# Patient Record
Sex: Female | Born: 1950 | Race: White | Hispanic: No | Marital: Married | Smoking: Former smoker
Health system: Southern US, Community
[De-identification: ages and names within clinical notes are randomized; demographics above are authoritative.]

## PROBLEM LIST (undated history)

## (undated) DIAGNOSIS — H9319 Tinnitus, unspecified ear: Secondary | ICD-10-CM

## (undated) DIAGNOSIS — M4150 Other secondary scoliosis, site unspecified: Secondary | ICD-10-CM

## (undated) DIAGNOSIS — N393 Stress incontinence (female) (male): Secondary | ICD-10-CM

## (undated) DIAGNOSIS — M7070 Other bursitis of hip, unspecified hip: Secondary | ICD-10-CM

## (undated) DIAGNOSIS — E782 Mixed hyperlipidemia: Secondary | ICD-10-CM

## (undated) DIAGNOSIS — M47812 Spondylosis without myelopathy or radiculopathy, cervical region: Secondary | ICD-10-CM

## (undated) DIAGNOSIS — E538 Deficiency of other specified B group vitamins: Secondary | ICD-10-CM

## (undated) DIAGNOSIS — M7541 Impingement syndrome of right shoulder: Secondary | ICD-10-CM

## (undated) DIAGNOSIS — N63 Unspecified lump in unspecified breast: Secondary | ICD-10-CM

## (undated) DIAGNOSIS — M7071 Other bursitis of hip, right hip: Secondary | ICD-10-CM

## (undated) DIAGNOSIS — Z973 Presence of spectacles and contact lenses: Secondary | ICD-10-CM

## (undated) DIAGNOSIS — F411 Generalized anxiety disorder: Secondary | ICD-10-CM

## (undated) DIAGNOSIS — F32A Depression, unspecified: Secondary | ICD-10-CM

## (undated) DIAGNOSIS — R112 Nausea with vomiting, unspecified: Secondary | ICD-10-CM

## (undated) DIAGNOSIS — M5136 Other intervertebral disc degeneration, lumbar region: Secondary | ICD-10-CM

## (undated) DIAGNOSIS — H409 Unspecified glaucoma: Secondary | ICD-10-CM

## (undated) HISTORY — DX: Spondylosis without myelopathy or radiculopathy, cervical region: M47.812

## (undated) HISTORY — PX: CARPAL TUNNEL RELEASE: SHX101

## (undated) HISTORY — PX: PLANTAR FASCIA SURGERY: SHX746

## (undated) HISTORY — PX: TUBAL LIGATION: SHX77

## (undated) HISTORY — DX: Other bursitis of hip, unspecified hip: M70.70

## (undated) HISTORY — DX: Deficiency of other specified B group vitamins: E53.8

## (undated) HISTORY — DX: Generalized anxiety disorder: F41.1

## (undated) HISTORY — DX: Mixed hyperlipidemia: E78.2

## (undated) HISTORY — DX: Tinnitus, unspecified ear: H93.19

## (undated) HISTORY — DX: Unspecified lump in unspecified breast: N63.0

---

## 2008-06-08 IMAGING — US MAMMO-RUNI-US
1 series · 6 of 6 positions shown · non-contrast
Comparison: NONE

CLINICAL DATA: Rotchel Aleem.(GRIMMEL)(SINE)   Diagnostic Mammogram. 

RIGHT BREAST MAMMOGRAM ADDITIONAL VIEWS AND RIGHT BREAST 
ULTRASOUND

[Series 1: us breast · 0.14mm/px · 6 of 6 slices shown]
[im 1/6]
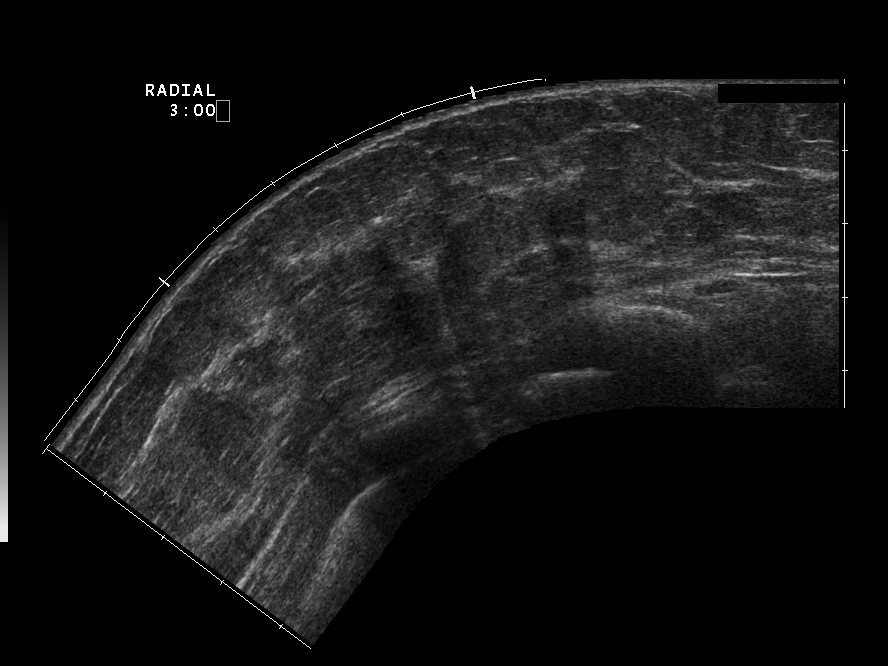
[im 2/6]
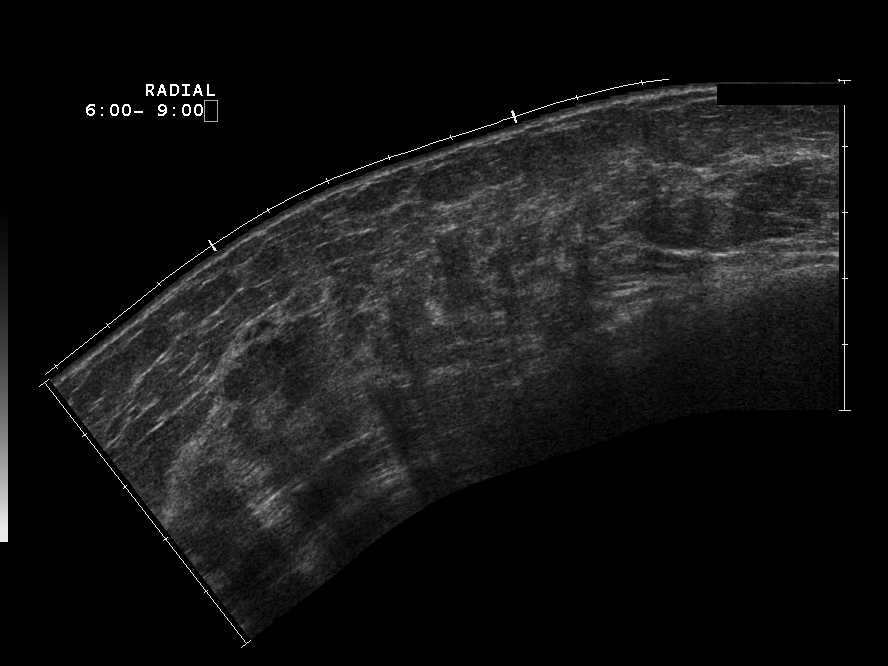
[im 3/6]
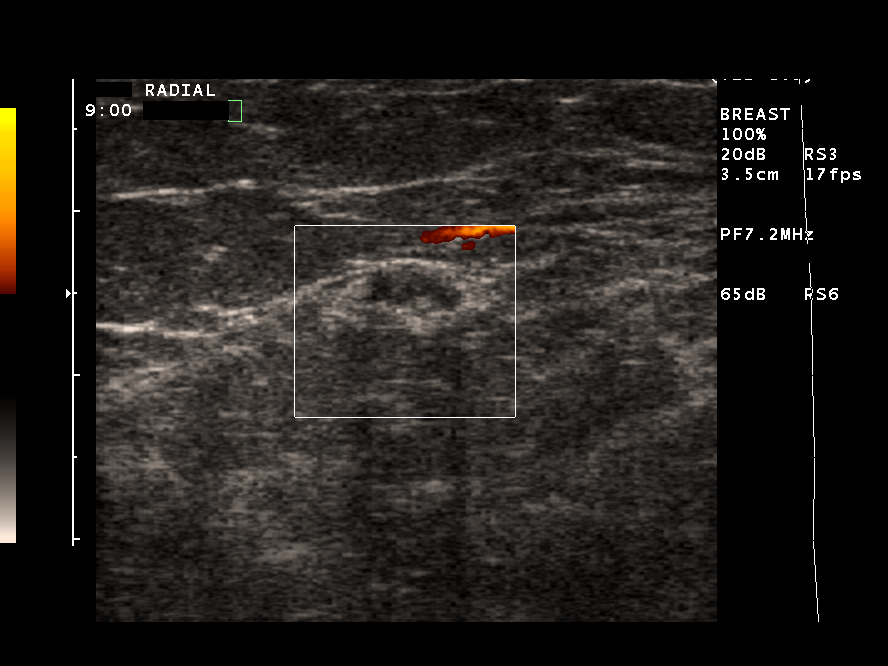
[im 4/6]
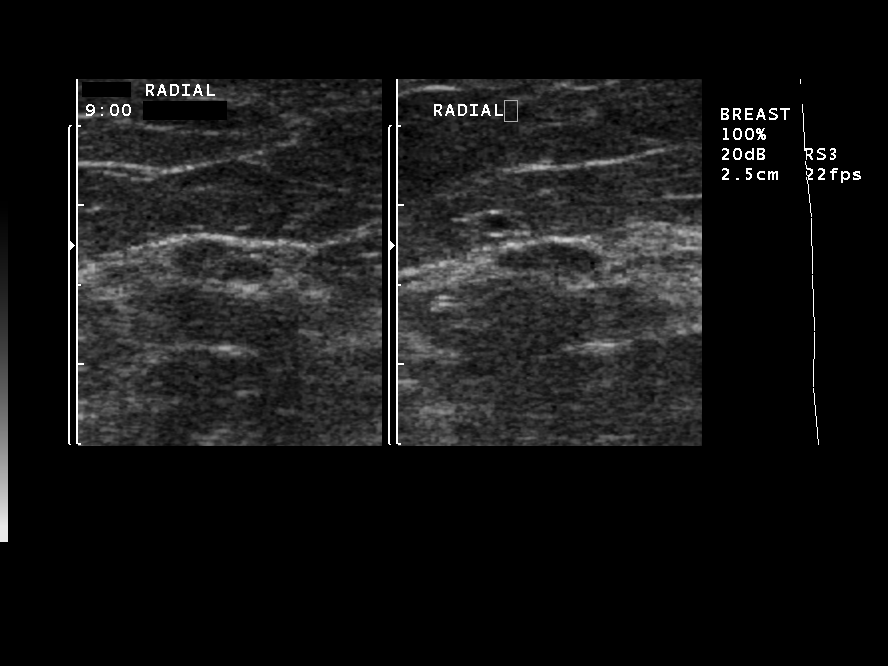
[im 5/6]
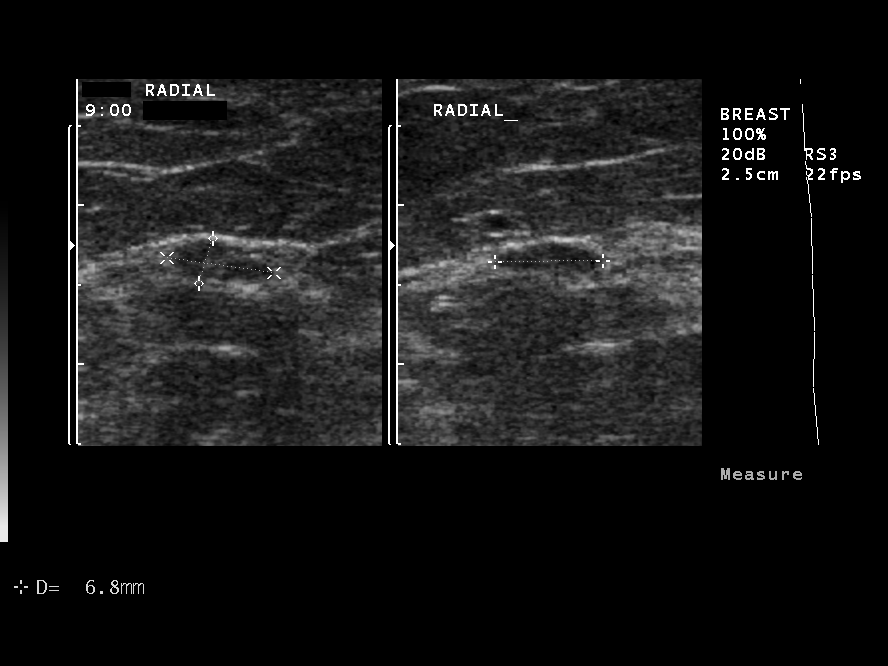
[im 6/6]
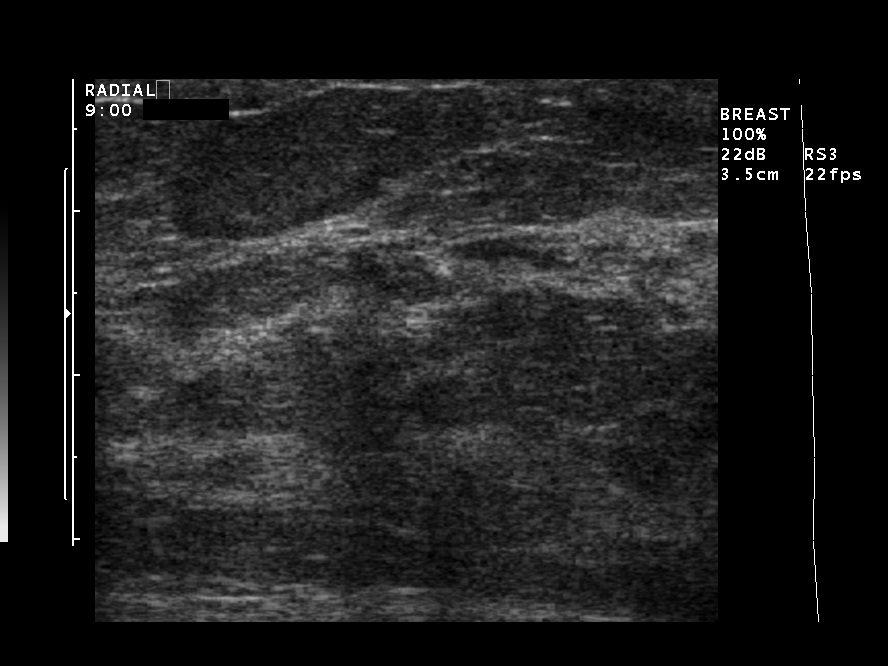

[6 of 6 positions shown; findings below may reference images not displayed]

FINDINGS: Spot compression of the right breast in the CC view 
and true lateral view of the right breast fails to demonstrate an 
area of architectural distortion, spiculation, mass or suspicious 
calcification.  Right breast ultrasound demonstrates minimal 
fibrocystic change in the 9 o???clock position.  No suspicious solid 
or shadowing masses.
IMPRESSION: Benign appearing changes mammographically and by 
ultrasound.  Bilateral mammogram in one year is recommended. The 
patient was informed at the time of the examination of the 
findings and recommendations by verbal and written lay report. 
Computer assisted (Second Look) technology was used as an aid in 
interpretation of this study. BI-RADS 2- Benign Eq, Dante Alberto Date:  12/19/2007 IOLANDO ZARDINI

## 2008-12-10 HISTORY — PX: PLANTAR FASCIA SURGERY: SHX746

## 2008-12-10 HISTORY — PX: CARPAL TUNNEL RELEASE: SHX101

## 2011-09-10 ENCOUNTER — Emergency Department (HOSPITAL_COMMUNITY)
Admission: EM | Admit: 2011-09-10 | Discharge: 2011-09-11 | Disposition: A | Discharge status: Home / Self Care | Payer: 59 | Attending: Emergency Medicine | Admitting: Emergency Medicine

## 2011-09-10 DIAGNOSIS — R300 Dysuria: Secondary | ICD-10-CM | POA: Insufficient documentation

## 2011-09-10 DIAGNOSIS — N390 Urinary tract infection, site not specified: Secondary | ICD-10-CM | POA: Insufficient documentation

## 2011-09-10 LAB — URINALYSIS, ROUTINE W REFLEX MICROSCOPIC
Nitrite: POSITIVE — AB
Protein, ur: 100 mg/dL — AB
Specific Gravity, Urine: 1.008 (ref 1.005–1.030)
Urobilinogen, UA: 1 mg/dL (ref 0.0–1.0)

## 2011-09-10 LAB — URINE MICROSCOPIC-ADD ON

## 2011-09-13 LAB — URINE CULTURE: Colony Count: >=100000

## 2017-03-25 ENCOUNTER — Ambulatory Visit (INDEPENDENT_AMBULATORY_CARE_PROVIDER_SITE_OTHER): Payer: Medicare HMO | Admitting: Podiatry

## 2017-03-25 ENCOUNTER — Ambulatory Visit (INDEPENDENT_AMBULATORY_CARE_PROVIDER_SITE_OTHER): Payer: Medicare HMO

## 2017-03-25 ENCOUNTER — Encounter: Payer: Self-pay | Admitting: Podiatry

## 2017-03-25 DIAGNOSIS — M19071 Primary osteoarthritis, right ankle and foot: Secondary | ICD-10-CM

## 2017-03-25 DIAGNOSIS — M79671 Pain in right foot: Secondary | ICD-10-CM

## 2017-03-25 DIAGNOSIS — M779 Enthesopathy, unspecified: Secondary | ICD-10-CM | POA: Diagnosis not present

## 2017-03-25 DIAGNOSIS — M7751 Other enthesopathy of right foot: Secondary | ICD-10-CM | POA: Diagnosis not present

## 2017-03-25 DIAGNOSIS — M778 Other enthesopathies, not elsewhere classified: Secondary | ICD-10-CM

## 2017-03-25 MED ORDER — NONFORMULARY OR COMPOUNDED ITEM: 1.0000 g | Freq: Four times a day (QID) | 2 refills | Status: DC

## 2017-03-28 NOTE — Progress Notes (Signed)
   Subjective: Patient is a 66 year old female presenting today as a new patient complaining of gradually worsening cramping foot pain to the right medial side near the arch that has been ongoing for approximately one year. Standing on her feet for prolonged periods of time increases the pain. She denies any trauma or injury.    Objective/Physical Exam General: The patient is alert and oriented x3 in no acute distress.  Dermatology: Skin is warm, dry and supple bilateral lower extremities. Negative for open lesions or macerations.  Vascular: Palpable pedal pulses bilaterally. No edema or erythema noted. Capillary refill within normal limits.  Neurological: Epicritic and protective threshold grossly intact bilaterally.   Musculoskeletal Exam: Pain with palpation to the right 1st met-cuneiform joint. Range of motion within normal limits to all pedal and ankle joints bilateral. Muscle strength 5/5 in all groups bilateral.  Radiographic Exam:  No fracture identified. Degenerative changes noted diffusely throughout the midfoot consistent with the midfoot DJD/arthritis. 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 #2 anti-inflammatory pain cream to be dispensed through [facility] #3 Injection of 0.5 mLs Celestone Soluspan injected into 1st met-cuneiform joint-right #4 continue OTC Aleve #5 Return to clinic when necessary   Edrick Kins, DPM [facility]  Dr. Edrick Kins, [address]                                        [place], [place] [address]                Office [phone]  Fax [phone]

## 2017-04-01 MED ORDER — BETAMETHASONE SOD PHOS & ACET 6 (3-3) MG/ML IJ SUSP: 3.0000 mg | Freq: Once | INTRAMUSCULAR | Status: DC

## 2018-03-06 ENCOUNTER — Encounter: Payer: Self-pay | Admitting: Neurology

## 2018-03-06 ENCOUNTER — Ambulatory Visit: Payer: Medicare HMO | Admitting: Neurology

## 2018-03-06 ENCOUNTER — Encounter (INDEPENDENT_AMBULATORY_CARE_PROVIDER_SITE_OTHER): Payer: Self-pay

## 2018-03-06 VITALS — BP 125/65 | HR 64 | Ht 64.0 in | Wt 162.0 lb

## 2018-03-06 DIAGNOSIS — E663 Overweight: Secondary | ICD-10-CM | POA: Diagnosis not present

## 2018-03-06 DIAGNOSIS — G479 Sleep disorder, unspecified: Secondary | ICD-10-CM | POA: Diagnosis not present

## 2018-03-06 DIAGNOSIS — F39 Unspecified mood [affective] disorder: Secondary | ICD-10-CM | POA: Diagnosis not present

## 2018-03-06 DIAGNOSIS — R0683 Snoring: Secondary | ICD-10-CM

## 2018-03-06 DIAGNOSIS — G4719 Other hypersomnia: Secondary | ICD-10-CM | POA: Diagnosis not present

## 2018-03-06 DIAGNOSIS — R351 Nocturia: Secondary | ICD-10-CM | POA: Diagnosis not present

## 2018-03-06 NOTE — Patient Instructions (Addendum)

## 2018-03-06 NOTE — Progress Notes (Signed)
Subjective:    Patient ID: Deborah Fowler is a 67 y.o. female.  HPI     Star Age, MD, PhD [facility] [address], [address] [place] [address], [place] [address]  Dear Dr. Rex Kras,   I saw your patient, Deborah Fowler, upon your kind request in my neurologic clinic today for initial consultation of her sleep disorder, in particular, concern for underlying obstructive sleep apnea. The patient is unaccompanied today. As you know, Deborah Fowler is a 67 year old right-handed woman with an underlying medical history of hyperlipidemia, vitamin B12 deficiency, neck pain, and overweight state, who reports snoring and excessive daytime somnolence as well as sleep disruption. I reviewed your office note from 01/23/2018, which you kindly included. She is married and lives with her husband. They have 1 child. She quit smoking in 1980, drinks alcohol occasionally, 1 or 2 glasses per month on average, caffeine in the form of coffee, 2 cups per day on average. Her Epworth sleepiness score is 12 out of 24 today, fatigue score is 19 out of 63. She works for [employer], part-time, 3 days a week. She has to get up at 5 or 6 for this, otherwise she can sleep in and reports that she can sleep up to 12 hours at a time. She has noted recently flareup of her depression. She has a long-standing history of depression and has been off and on medication since her 23s. She restarted sertraline some 5 months ago and noticed an improvement. She noticed increase in anxiety and was started on clonazepam, she currently takes 0.5 mg at night. She has nocturia about once per average night, denies family history of sleep apnea or morning headaches. She has vivid dreams. Her husband has sleep apnea and uses a CPAP machine so she is familiar with CPAP, she is apprehensive about coming in for sleep study and considering CPAP. His snoring has actually improved since she was able to lose weight. She has lost about 38  pounds in one year through [employer].  Her Past Medical History Is Significant For: Past Medical History:  Diagnosis Date  . Anxiety state   . Breast lump   . Bursitis of hip   . Cervical arthritis   . Cobalamin deficiency   . Mixed hyperlipidemia   . Tinnitus     Her Past Surgical History Is Significant For:  Her Family History Is Significant For: No family history on file.  Her Social History Is Significant For: Social History   Socioeconomic History  . Marital status: Married    Spouse name: Not on file  . Number of children: Not on file  . Years of education: Not on file  . Highest education level: Not on file  Occupational History  . Not on file  Social Needs  . Financial resource strain: Not on file  . Food insecurity:    Worry: Not on file    Inability: Not on file  . Transportation needs:    Medical: Not on file    Non-medical: Not on file  Tobacco Use  . Smoking status: Unknown If Ever Smoked  . Smokeless tobacco: Never Used  Substance and Sexual Activity  . Alcohol use: Not on file  . Drug use: Not on file  . Sexual activity: Not on file  Lifestyle  . Physical activity:    Days per week: Not on file    Minutes per session: Not on file  . Stress: Not on file  Relationships  .  Social connections:    Talks on phone: Not on file    Gets together: Not on file    Attends religious service: Not on file    Active member of club or organization: Not on file    Attends meetings of clubs or organizations: Not on file    Relationship status: Not on file  Other Topics Concern  . Not on file  Social History Narrative  . Not on file    Her Allergies Are:  No Known Allergies:   Her Current Medications Are:  Outpatient Encounter Medications as of 03/06/2018  Medication Sig  . aspirin 325 MG tablet Take 325 mg by mouth daily.  . cetirizine (ZYRTEC) 10 MG tablet Take 10 mg by mouth daily.  . cholecalciferol (VITAMIN D) 1000 units tablet Take 1,000  Units by mouth daily.  . clonazePAM (KLONOPIN) 0.5 MG tablet Take 0.5 mg by mouth daily.  Marland Kitchen dicyclomine (BENTYL) 10 MG capsule Take 20 mg by mouth daily.  Marland Kitchen latanoprost (XALATAN) 0.005 % ophthalmic solution   . Omega-3 Fatty Acids (FISH OIL PO) Take 1,000 mg by mouth daily.  . polyethylene glycol (MIRALAX / GLYCOLAX) packet Take 17 g by mouth daily as needed.  . sertraline (ZOLOFT) 100 MG tablet Take 150 mg by mouth daily.  . timolol (TIMOPTIC) 0.5 % ophthalmic solution   . TRIAMCINOLONE ACETONIDE, TOP, 0.05 % OINT Apply topically.  . vitamin B-12 (CYANOCOBALAMIN) 1000 MCG tablet Take 1,000 mcg by mouth daily.  . [DISCONTINUED] celecoxib (CELEBREX) 200 MG capsule Take 200 mg by mouth daily.  . [DISCONTINUED] methocarbamol (ROBAXIN) 750 MG tablet Take 750 mg by mouth every 6 (six) hours as needed for muscle spasms.  . [DISCONTINUED] NONFORMULARY OR COMPOUNDED ITEM Apply 1-2 g topically 4 (four) times daily.  . [DISCONTINUED] oxybutynin (DITROPAN) 5 MG tablet Take 5 mg by mouth 2 (two) times daily as needed for bladder spasms.  . [DISCONTINUED] sertraline (ZOLOFT) 50 MG tablet Take 50 mg by mouth.   Facility-Administered Encounter Medications as of 03/06/2018  Medication  . betamethasone acetate-betamethasone sodium phosphate (CELESTONE) injection 3 mg  :  Review of Systems:  Out of a complete 14 point review of systems, all are reviewed and negative with the exception of these symptoms as listed below: Review of Systems  Neurological:       Pt presents today to discuss her sleep. Pt's apple watch informed her that she may not be sleeping well, and Dr. Rex Kras believes that pt needs a sleep study.   Epworth Sleepiness Scale 0= would never doze 1= slight chance of dozing 2= moderate chance of dozing 3= high chance of dozing  Sitting and reading: 2 Watching TV: 2 Sitting inactive in a public place (ex. Theater or meeting): 2 As a passenger in a car for an hour without a break: 3 Lying  down to rest in the afternoon: 3 Sitting and talking to someone: 0 Sitting quietly after lunch (no alcohol): 0 In a car, while stopped in traffic: 0 Total: 12     Objective:  Neurological Exam  Physical Exam Physical Examination:   Vitals:   03/06/18 0900  BP: 125/65  Pulse: 64    General Examination: The patient is a very pleasant 67 y.o. female in no acute distress. She appears well-developed and well-nourished and very well groomed.   HEENT: Normocephalic, atraumatic, pupils are equal, round and reactive to light and accommodation. Corrective eye glasses. Extraocular tracking is good without limitation to gaze excursion or  nystagmus noted. Normal smooth pursuit is noted. Hearing is grossly intact. Face is symmetric with normal facial animation and normal facial sensation. Speech is clear with no dysarthria noted. There is no hypophonia. There is no lip, neck/head, jaw or voice tremor. Neck is supple with full range of passive and active motion. There are no carotid bruits on auscultation. Oropharynx exam reveals: mild mouth dryness, good dental hygiene and mild airway crowding, due to smaller airway entry. Mallampati is class II. Tongue protrudes centrally and palate elevates symmetrically. Tonsils are absent. Neck size is 13  7/85 inches. She has a mild overbite.   Chest: Clear to auscultation without wheezing, rhonchi or crackles noted.  Heart: S1+S2+0, regular and normal without murmurs, rubs or gallops noted.   Abdomen: Soft, non-tender and non-distended with normal bowel sounds appreciated on auscultation.  Extremities: There is no pitting edema in the distal lower extremities bilaterally. Pedal pulses are intact.  Skin: Warm and dry without trophic changes noted.  Musculoskeletal: exam reveals no obvious joint deformities, tenderness or joint swelling or erythema.   Neurologically:  Mental status: The patient is awake, alert and oriented in all 4 spheres. Her immediate  and remote memory, attention, language skills and fund of knowledge are appropriate. There is no evidence of aphasia, agnosia, apraxia or anomia. Speech is clear with normal prosody and enunciation. Thought process is linear. Mood is normal and affect is normal.  Cranial nerves II - XII are as described above under HEENT exam. In addition: shoulder shrug is normal with equal shoulder height noted. Motor exam: Normal bulk, strength and tone is noted. There is no drift, tremor or rebound. Romberg is negative. Fine motor skills and coordination: intact with normal finger taps, normal hand movements, normal rapid alternating patting, normal foot taps and normal foot agility.  Cerebellar testing: No dysmetria or intention tremor on finger to nose testing. Heel to shin is unremarkable bilaterally. There is no truncal or gait ataxia.  Sensory exam: intact to light touch in the upper and lower extremities.  Gait, station and balance: She stands easily. No veering to one side is noted. No leaning to one side is noted. Posture is age-appropriate and stance is narrow based. Gait shows normal stride length and normal pace. No problems turning are noted. Tandem walk is challenging for her.   Assessment and Plan:  In summary, Deborah Fowler is a very pleasant 67 y.o.-year old female with an underlying medical history of hyperlipidemia, vitamin B12 deficiency, neck pain, and overweight state, whose history and physical exam are concerning for obstructive sleep apnea (OSA). I had a long chat with the patient about my findings and the diagnosis of OSA, its prognosis and treatment options. We talked about medical treatments, surgical interventions and non-pharmacological approaches. I explained in particular the risks and ramifications of untreated moderate to severe OSA, especially with respect to developing cardiovascular disease down the Road, including congestive heart failure, difficult to treat hypertension, cardiac  arrhythmias, or stroke. Even type 2 diabetes has, in part, been linked to untreated OSA. Symptoms of untreated OSA include daytime sleepiness, memory problems, mood irritability and mood disorder such as depression and anxiety, lack of energy, as well as recurrent headaches, especially morning headaches. We talked about trying to maintain a healthy lifestyle in general, as well as the importance of weight control. I encouraged the patient to eat healthy, exercise daily and keep well hydrated, to keep a scheduled bedtime and wake time routine, to not skip any meals and  eat healthy snacks in between meals. I advised the patient not to drive when feeling sleepy. I recommended the following at this time: sleep study with potential positive airway pressure titration. (We will score hypopneas at 4%).   I explained the sleep test procedure to the patient and also outlined possible surgical and non-surgical treatment options of OSA, including the use of a custom-made dental device (which would require a referral to a specialist dentist or oral surgeon), upper airway surgical options, such as pillar implants, radiofrequency surgery, tongue base surgery, and UPPP (which would involve a referral to an ENT surgeon). Rarely, jaw surgery such as mandibular advancement may be considered.  I also explained the CPAP treatment option to the patient, who indicated that she would be willing to try CPAP if the need arises. I explained the importance of being compliant with PAP treatment, not only for insurance purposes but primarily to improve Her symptoms, and for the patient's long term health benefit, including to reduce Her cardiovascular risks. I answered all her questions today and the patient was in agreement. I would like to see her back after the sleep study is completed and encouraged her to call with any interim questions, concerns, problems or updates.   Thank you very much for allowing me to participate in the care  of this nice patient. If I can be of any further assistance to you please do not hesitate to call me at [phone].  Sincerely,   Star Age, MD, PhD

## 2018-03-13 LAB — BASIC METABOLIC PANEL
BUN: 23 — AB (ref 4–21)
Creatinine: 0.7 (ref 0.5–1.1)
Glucose: 80
Potassium: 4.3 (ref 3.4–5.3)
Sodium: 141 (ref 137–147)

## 2018-03-13 LAB — LIPID PANEL
Cholesterol: 291 — AB (ref 0–200)
HDL: 65 (ref 35–70)
LDL Cholesterol: 200
Triglycerides: 118 (ref 40–160)

## 2018-03-13 LAB — TSH: TSH: 2.53 (ref 0.41–5.90)

## 2018-03-13 LAB — HEPATIC FUNCTION PANEL
ALT: 13 (ref 7–35)
AST: 15 (ref 13–35)
Alkaline Phosphatase: 58 (ref 25–125)
Bilirubin, Total: 0.4

## 2018-03-13 LAB — CBC AND DIFFERENTIAL
HCT: 42 (ref 36–46)
Hemoglobin: 14 (ref 12.0–16.0)
Neutrophils Absolute: 3014
Platelets: 180 (ref 150–399)
WBC: 5.5

## 2018-03-13 LAB — HEMOGLOBIN A1C: Hemoglobin A1C: 5.3

## 2018-03-13 LAB — VITAMIN B12: Vitamin B-12: >2000

## 2018-03-24 ENCOUNTER — Telehealth: Payer: Self-pay

## 2018-03-24 NOTE — Telephone Encounter (Signed)
Pt has called to cancel sleep study on 04/11/18. Pt does mot want to reschedule at this time.

## 2018-04-17 DIAGNOSIS — H401123 Primary open-angle glaucoma, left eye, severe stage: Secondary | ICD-10-CM | POA: Insufficient documentation

## 2019-05-28 ENCOUNTER — Other Ambulatory Visit: Payer: Self-pay

## 2019-05-28 ENCOUNTER — Encounter: Payer: Self-pay | Admitting: Family Medicine

## 2019-05-28 ENCOUNTER — Ambulatory Visit (INDEPENDENT_AMBULATORY_CARE_PROVIDER_SITE_OTHER): Payer: Medicare HMO | Admitting: Family Medicine

## 2019-05-28 VITALS — BP 125/83 | HR 70 | Temp 98.6°F | Ht 63.0 in | Wt 193.3 lb

## 2019-05-28 DIAGNOSIS — K5909 Other constipation: Secondary | ICD-10-CM | POA: Insufficient documentation

## 2019-05-28 DIAGNOSIS — K6289 Other specified diseases of anus and rectum: Secondary | ICD-10-CM | POA: Diagnosis not present

## 2019-05-28 DIAGNOSIS — F411 Generalized anxiety disorder: Secondary | ICD-10-CM | POA: Diagnosis not present

## 2019-05-28 DIAGNOSIS — R7303 Prediabetes: Secondary | ICD-10-CM | POA: Diagnosis not present

## 2019-05-28 DIAGNOSIS — E785 Hyperlipidemia, unspecified: Secondary | ICD-10-CM

## 2019-05-28 DIAGNOSIS — F339 Major depressive disorder, recurrent, unspecified: Secondary | ICD-10-CM

## 2019-05-28 DIAGNOSIS — R030 Elevated blood-pressure reading, without diagnosis of hypertension: Secondary | ICD-10-CM

## 2019-05-28 DIAGNOSIS — M7071 Other bursitis of hip, right hip: Secondary | ICD-10-CM | POA: Diagnosis not present

## 2019-05-28 DIAGNOSIS — M7072 Other bursitis of hip, left hip: Secondary | ICD-10-CM

## 2019-05-28 DIAGNOSIS — Z6834 Body mass index (BMI) 34.0-34.9, adult: Secondary | ICD-10-CM | POA: Diagnosis not present

## 2019-05-28 NOTE — Progress Notes (Signed)
New patient office visit note:  Impression and Recommendations:    1. Prediabetes   2. Bursitis of both hips, unspecified bursa   3. Chronic constipation- yrs and yrs.    4. Depression, recurrent ([place])   5. GAD (generalized anxiety disorder)   6. 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 Hyperlipidemia, unspecified hyperlipidemia type   8. Elevated blood pressure reading   9. BMI 34.0-34.9,adult     1. Prediabetes/elevated BMI Discussed with patient weight loss-she will continue with weight watchers for now. -Do not eat her exercise calories.  2. Bursitis of both hips, unspecified bursa Left hip greater than right.  Patient has history of injections. -Advised to avoid any inclines or declines in walking. -We will avoid biking climbing etc. -Ice 15- 20 minutes after every activity, and PRN pain -Follow-up if worsens and we may need x-rays and plus minus injection. -Could not advise for /against Celebrex or Aleve etc. since I do not know what her liver and kidney lab results are.  3. Chronic constipation- yrs and yrs.  Discussed with patient adequate hydration is imperative.  Only taking in less than 30 ounces daily-goal is 100 ounces daily. -May take 1/2-1 of MiraLAX daily.  4. Depression, recurrent ([place]) Has suffered from chronic depression most her life. -On Zoloft which helps with this as well as her anxiety.  5. GAD (generalized anxiety disorder) Having anxiety panic attacks. -Had used Xanax in the past but recommended patient does not use any clonazepam or Xanax for this. -Discussed with patient calm and headspace apps to do relaxation techniques several times daily.  6. 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 -Recommend she follow-up with gastroenterology for this in the future.  -Advised portable bidet to add to her toilet  7. Hyperlipidemia, unspecified hyperlipidemia type Patient did not tell me she had history of hyperlipidemia but according to medical records she does. \-We will obtain fasting lipid profile near future  8. Elevated blood pressure reading Patient blood pressure elevated today.  She denies history of hypertension.  We will recheck and have her monitor at home and bring back in near future  Education and routine counseling performed. Handouts provided.  -Asked CMA to please order full set of fasting blood work for near future and patient will make a video visit follow-up to discuss those results.   Gross side effects, risk and benefits, and alternatives of medications discussed with patient.  Patient is aware that all medications have potential side effects and we are unable to predict every side effect or drug-drug interaction that may occur.  Expresses verbal understanding and consents to current therapy plan and treatment regimen.  Return for follow-up near future FBW, then 2) doxy OV visit 3-5 days later.  Please see AVS handed out to patient at the end of our visit for further patient instructions/ counseling done pertaining to today's office visit.    Note:  This document was prepared using Dragon voice recognition software and may include unintentional dictation errors.     ---------------------------------------------------------------------------------------------------------------------------------------------------------------------------------------------    Subjective:    Chief complaint:   Chief Complaint  Patient presents with  . Establish Care     HPI: Deborah Fowler is a pleasant 68 y.o. female who presents to [place] [facility]  today to review their medical history with me and establish care.   I asked the patient to review their chronic problem list with me to ensure everything was updated and  accurate.    All recent office visits with other providers, any medical records that patient brought in etc  - I reviewed today.     We asked pt to get [place] their medical records from [facility] providers/ specialists that they had seen within the past 3-5 years- if they are in private practice and/or do not work for [employer], [facility], [place], [place] or [employer] owned practice.  Told them to call their specialists to clarify this if they are not sure.    2 yrs lost 47 lbs- [employer], recently went back on it.    Works part time at [employer] her job.   Biggest concerns- bursitis- in jhips- L >er R   Wt Readings from Last 3 Encounters:  06/11/19 195 lb (88.5 kg)  05/28/19 193 lb 4.8 oz (87.7 kg)  03/06/18 162 lb (73.5 kg)   BP Readings from Last 3 Encounters:  06/11/19 127/82  05/28/19 125/83  03/06/18 125/65   Pulse Readings from Last 3 Encounters:  05/28/19 70  03/06/18 64   BMI Readings from Last 3 Encounters:  06/11/19 34.54 kg/m  05/28/19 34.24 kg/m  03/06/18 27.81 kg/m    Patient Care Team    Relationship Specialty Notifications Start End  Mellody Dance, DO PCP - General Family Medicine  05/21/19   Tamsen Roers, MD Referring Physician Family Medicine  05/21/19     Patient Active Problem List   Diagnosis Date Noted  . Prediabetes 05/28/2019    Priority: High  . HLD (hyperlipidemia) 05/28/2019    Priority: High  . Elevated blood pressure reading 05/28/2019    Priority: High  . Bilateral hip bursitis 05/28/2019    Priority: Medium  . Depression, recurrent ([place]) 05/28/2019    Priority: Medium  . GAD (generalized anxiety disorder) with panic disorder 05/28/2019    Priority: Medium  . Chronic constipation- yrs and yrs.  05/28/2019    Priority: Low  . 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 Primary open angle glaucoma of left eye, severe stage 04/17/2018       As reported by pt:   Past Medical History:  Diagnosis Date  . Anxiety state   . Breast lump   . Bursitis of hip   . Cervical arthritis   . Cobalamin deficiency   . Mixed hyperlipidemia   . Tinnitus      Past Surgical History:  Procedure Laterality Date  . CARPAL TUNNEL RELEASE    . PLANTAR FASCIA SURGERY       Family History  Problem Relation Age of Onset  . Hyperlipidemia Mother   . Alcohol abuse Father   . Heart attack Brother   . Colon cancer Maternal Grandmother      Social History   Substance and Sexual Activity  Drug Use Never     Social History   Substance and Sexual Activity  Alcohol Use Yes  . Alcohol/week: 5.0 standard drinks  . Types: 5 Standard drinks or equivalent per week     Social History   Tobacco Use  Smoking Status Unknown If Ever Smoked  Smokeless Tobacco Never Used     Current Meds  Medication Sig  . ALPRAZolam (XANAX) 0.5 MG tablet Take 0.5 mg by  mouth as needed for anxiety.  . Calcium Carbonate (CALCIUM 600 PO) Take 1,200 mg by mouth.  . celecoxib (CELEBREX) 200 MG capsule Take 200 mg by mouth daily.  . cetirizine (ZYRTEC) 10 MG tablet Take 10 mg by mouth daily.  Marland Kitchen latanoprost (XALATAN) 0.005 % ophthalmic solution   . Omega-3 Fatty Acids (FISH OIL PO) Take 1,000 mg by mouth daily.  . polyethylene glycol (MIRALAX / GLYCOLAX) packet Take 17 g by mouth daily as needed.  . RESTASIS 0.05 % ophthalmic emulsion INSTILL 1 DROP INTO EACH EYE TWICE DAILY  . timolol (TIMOPTIC) 0.5 % ophthalmic solution   . TRIAMCINOLONE ACETONIDE, TOP, 0.05 % OINT Apply topically.  . vitamin B-12 (CYANOCOBALAMIN) 1000 MCG tablet Take 1,000 mcg by mouth daily.  . [DISCONTINUED] aspirin 325 MG tablet Take 325 mg by mouth 2 (two) times a day.   . [DISCONTINUED] sertraline (ZOLOFT) 100 MG tablet Take 100 mg by mouth daily.    Current Facility-Administered Medications for the 05/28/19 encounter (Office Visit) with Mellody Dance, DO  Medication  . betamethasone  acetate-betamethasone sodium phosphate (CELESTONE) injection 3 mg    Allergies: Patient has no known allergies.   Review of Systems  Constitutional: Negative for chills, diaphoresis, fever, malaise/fatigue and weight loss.  HENT: Negative for congestion, sore throat and tinnitus.   Eyes: Negative for blurred vision, double vision and photophobia.  Respiratory: Negative for cough and wheezing.   Cardiovascular: Negative for chest pain and palpitations.  Gastrointestinal: Negative for blood in stool, diarrhea, nausea and vomiting.  Genitourinary: Negative for dysuria, frequency and urgency.       Leaking urine  Musculoskeletal: Positive for joint pain and myalgias.       Bilateral hip bursitis-chronic  Skin: Negative for itching and rash.  Neurological: Negative for dizziness, focal weakness, weakness and headaches.  Endo/Heme/Allergies: Negative for environmental allergies and polydipsia. Does not bruise/bleed easily.  Psychiatric/Behavioral: Negative for depression and memory loss. The patient is not nervous/anxious and does not have insomnia.         Objective:   Blood pressure 125/83, pulse 70, temperature 98.6 F (37 C), temperature source Oral, height 5\' 3"  (1.6 m), weight 193 lb 4.8 oz (87.7 kg), SpO2 95 %. Body mass index is 34.24 kg/m. General: Well Developed, well nourished, and in no acute distress.  Neuro: Alert and oriented x3, extra-ocular muscles intact, sensation grossly intact.  HEENT:[place]/AT, PERRLA, neck supple, No carotid bruits Skin: no gross rashes  Cardiac: Regular rate and rhythm Respiratory: Essentially clear to auscultation bilaterally. Not using accessory muscles, speaking in full sentences.  Abdominal: not grossly distended Musculoskeletal: Ambulates w/o diff, FROM * 4 ext.  Vasc: less 2 sec cap RF, warm and pink  Psych:  No HI/SI, judgement and insight good, Euthymic mood. Full Affect.    Recent Results (from the past 2160 hour(s))  TSH      Status: None   Collection Time: 06/09/19  9:54 AM  Result Value Ref Range   TSH 4.070 0.450 - 4.500 uIU/mL  VITAMIN D 25 Hydroxy (Vit-D Deficiency, Fractures)     Status: None   Collection Time: 06/09/19  9:54 AM  Result Value Ref Range   Vit D, 25-Hydroxy 35.4 30.0 - 100.0 ng/mL    Comment: Vitamin D deficiency has been defined by the [place] and an Endocrine Society practice guideline as a level of serum 25-OH vitamin D less than 20 ng/mL (1,2). The Endocrine Society went on to further define vitamin D insufficiency  as a level between 21 and 29 ng/mL (2). 1. IOM (Institute of Medicine). 2010. Dietary reference    intakes for calcium and D. [place]: The    [employer]. 2. Holick MF, Binkley Seymour, Bischoff-Ferrari HA, et al.    Evaluation, treatment, and prevention of vitamin D    deficiency: an Endocrine Society clinical practice    guideline. JCEM. 2011 Jul; 96(7):1911-30.   T4, free     Status: None   Collection Time: 06/09/19  9:54 AM  Result Value Ref Range   Free T4 0.92 0.82 - 1.77 ng/dL  Lipid panel     Status: Abnormal   Collection Time: 06/09/19  9:54 AM  Result Value Ref Range   Cholesterol, Total 233 (H) 100 - 199 mg/dL   Triglycerides 196 (H) 0 - 149 mg/dL   HDL 61 >39 mg/dL   VLDL Cholesterol Cal 39 5 - 40 mg/dL   LDL Calculated 133 (H) 0 - 99 mg/dL   Chol/HDL Ratio 3.8 0.0 - 4.4 ratio    Comment:                                   T. Chol/HDL Ratio                                             Men  Women                               1/2 Avg.Risk  3.4    3.3                                   Avg.Risk  5.0    4.4                                2X Avg.Risk  9.6    7.1                                3X Avg.Risk 23.4   11.0   Hemoglobin A1c     Status: None   Collection Time: 06/09/19  9:54 AM  Result Value Ref Range   Hgb A1c MFr Bld 5.6 4.8 - 5.6 %    Comment:          Prediabetes: 5.7 - 6.4          Diabetes: >6.4           Glycemic control for adults with diabetes: <7.0    Est. average glucose Bld gHb Est-mCnc 114 mg/dL  Comprehensive metabolic panel     Status: Abnormal   Collection Time: 06/09/19  9:54 AM  Result Value Ref Range   Glucose 96 65 - 99 mg/dL   BUN 20 8 - 27 mg/dL   Creatinine, Ser 0.66 0.57 - 1.00 mg/dL   GFR calc non Af Amer 91 >59 mL/min/1.73   GFR calc Af Amer 105 >59 mL/min/1.73   BUN/Creatinine Ratio 30 (H) 12 - 28   Sodium 143 134 - 144 mmol/L   Potassium 5.2 3.5 -  5.2 mmol/L   Chloride 107 (H) 96 - 106 mmol/L   CO2 21 20 - 29 mmol/L   Calcium 9.2 8.7 - 10.3 mg/dL   Total Protein 6.0 6.0 - 8.5 g/dL   Albumin 4.2 3.8 - 4.8 g/dL   Globulin, Total 1.8 1.5 - 4.5 g/dL   Albumin/Globulin Ratio 2.3 (H) 1.2 - 2.2   Bilirubin Total 0.3 0.0 - 1.2 mg/dL   Alkaline Phosphatase 65 39 - 117 IU/L   AST 12 0 - 40 IU/L   ALT 14 0 - 32 IU/L  CBC with Differential/Platelet     Status: None   Collection Time: 06/09/19  9:54 AM  Result Value Ref Range   WBC 5.3 3.4 - 10.8 x10E3/uL   RBC 4.65 3.77 - 5.28 x10E6/uL   Hemoglobin 13.3 11.1 - 15.9 g/dL   Hematocrit 40.3 34.0 - 46.6 %   MCV 87 79 - 97 fL   MCH 28.6 26.6 - 33.0 pg   MCHC 33.0 31.5 - 35.7 g/dL   RDW 13.8 11.7 - 15.4 %   Platelets 189 150 - 450 x10E3/uL   Neutrophils 48 Not Estab. %   Lymphs 38 Not Estab. %   Monocytes 9 Not Estab. %   Eos 4 Not Estab. %   Basos 1 Not Estab. %   Neutrophils Absolute 2.6 1.4 - 7.0 x10E3/uL   Lymphocytes Absolute 2.0 0.7 - 3.1 x10E3/uL   Monocytes Absolute 0.5 0.1 - 0.9 x10E3/uL   EOS (ABSOLUTE) 0.2 0.0 - 0.4 x10E3/uL   Basophils Absolute 0.0 0.0 - 0.2 x10E3/uL   Immature Granulocytes 0 Not Estab. %   Immature Grans (Abs) 0.0 0.0 - 0.1 x10E3/uL

## 2019-05-28 NOTE — Patient Instructions (Signed)
Behavior Modification Ideas for Weight Management  Weight management involves adopting a healthy lifestyle that includes a knowledge of nutrition and exercise, a positive attitude and the right kind of motivation. Internal motives such as better health, increased energy, self-esteem and personal control increase your chances of lifelong weight management success.  Remember to have realistic goals and think long-term success. Believe in yourself and you can do it. The following information will give you ideas to help you meet your goals.  Control Your Home Environment  Eat only while sitting down at the kitchen or dining room table. Do not eat while watching television, reading, cooking, talking on the phone, standing at the refrigerator or working on the computer. Keep tempting foods out of the house -- don't buy them. Keep tempting foods out of sight. Have low-calorie foods ready to eat. Unless you are preparing a meal, stay out of the kitchen. Have healthy snacks at your disposal, such as small pieces of fruit, vegetables, canned fruit, pretzels, low-fat string cheese and nonfat cottage cheese.  Control Your Work Environment  Do not eat at [employer] or keep tempting snacks at your desk. If you get hungry between meals, plan healthy snacks and bring them with you to work. During your breaks, go for a walk instead of eating. If you work around food, plan in advance the one item you will eat at mealtime. Make it inconvenient to nibble on food by chewing gum, sugarless candy or drinking water or another low-calorie beverage. Do not work through meals. Skipping meals slows down metabolism and may result in overeating at the next meal. If food is available for special occasions, either pick the healthiest item, nibble on low-fat snacks brought from home, don't have anything offered, choose one option and have a small amount, or have only a beverage.  Control Your Mealtime Environment  Serve  your plate of food at the stove or kitchen counter. Do not put the serving dishes on the table. If you do put dishes on the table, remove them immediately when finished eating. Fill half of your plate with vegetables, a quarter with lean protein and a quarter with starch. Use smaller plates, bowls and glasses. A smaller portion will look large when it is in a little dish. Politely refuse second helpings. When fixing your plate, limit portions of food to one scoop/serving or less.   Daily Food Management  Replace eating with another activity that you will not associate with food. Wait 20 minutes before eating something you are craving. Drink a large glass of water or diet soda before eating. Always have a big glass or bottle of water to drink throughout the day. Avoid high-calorie add-ons such as cream with your coffee, butter, mayonnaise and salad dressings.  Shopping: Do not shop when hungry or tired. Shop from a list and avoid buying anything that is not on your list. If you must have tempting foods, buy individual-sized packages and try to find a lower-calorie alternative. Don't taste test in the store. Read food labels. Compare products to help you make the healthiest choices.  Preparation: Chew a piece of gum while cooking meals. Use a quarter teaspoon if you taste test your food. Try to only fix what you are going to eat, leaving yourself no chance for seconds. If you have prepared more food than you need, portion it into individual containers and freeze or refrigerate immediately. Don't snack while cooking meals.  Eating: Eat slowly. Remember it takes about 20 minutes  for your stomach to send a message to your brain that it is full. Don't let fake hunger make you think you need more. The ideal way to eat is to take a bite, put your utensil down, take a sip of water, cut your next bite, take a bit, put your utensil down and so on. Do not cut your food all at one time. Cut only  as needed. Take small bites and chew your food well. Stop eating for a minute or two at least once during a meal or snack. Take breaks to reflect and have conversation.  Cleanup and Leftovers: Label leftovers for a specific meal or snack. Freeze or refrigerate individual portions of leftovers. Do not clean up if you are still hungry.  Eating Out and Social Eating  Do not arrive hungry. Eat something light before the meal. Try to fill up on low-calorie foods, such as vegetables and fruit, and eat smaller portions of the high-calorie foods. Eat foods that you like, but choose small portions. If you want seconds, wait at least 20 minutes after you have eaten to see if you are actually hungry or if your eyes are bigger than your stomach. Limit alcoholic beverages. Try a soda water with a twist of lime. Do not skip other meals in the day to save room for the special event.  At Restaurants: Order  la carte rather than buffet style. Order some vegetables or a salad for an appetizer instead of eating bread. If you order a high-calorie dish, share it with someone. Try an after-dinner mint with your coffee. If you do have dessert, share it with two or more people. Don't overeat because you do not want to waste food. Ask for a doggie bag to take extra food home. Tell the server to put half of your entree in a to go bag before the meal is served to you. Ask for salad dressing, gravy or high-fat sauces on the side. Dip the tip of your fork in the dressing before each bite. If bread is served, ask for only one piece. Try it plain without butter or oil. At [employer] where oil and vinegar is served with bread, use only a small amount of oil and a lot of vinegar for dipping.  At a Friend[facility] House: Offer to bring a dish, appetizer or dessert that is low in calories. Serve yourself small portions or tell the host that you only want a small amount. Stand or sit away from the snack table. Stay  away from the kitchen or stay busy if you are near the food. Limit your alcohol intake.  At [employer] and Cafeterias: Cover most of your plate with lettuce and/or vegetables. Use a salad plate instead of a dinner plate. After eating, clear away your dishes before having coffee or tea.  Entertaining at Home: Explore low-fat, low-cholesterol cookbooks. Use single-serving foods like chicken breasts or hamburger patties. Prepare low-calorie appetizers and desserts.   Holidays: Keep tempting foods out of sight. Decorate the house without using food. Have low-calorie beverages and foods on hand for guests. Allow yourself one planned treat a day. Don't skip meals to save up for the holiday feast. Eat regular, planned meals.   Exercise Well  Make exercise a priority and a planned activity in the day. If possible, walk the entire or part of the distance to work. Get an exercise buddy. Go for a walk with a colleague during one of your breaks, go to the gym, run or  take a walk with a friend, walk in the mall with a shopping companion. Park at the end of the parking lot and walk to the store or office entrance. Always take the stairs all of the way or at least part of the way to your floor. If you have a desk job, walk around the office frequently. Do leg lifts while sitting at your desk. Do something outside on the weekends like going for a hike or a bike ride.   Have a Healthy Attitude  Make health your weight management priority. Be realistic. Have a goal to achieve a healthier you, not necessarily the lowest weight or ideal weight based on calculations or tables. Focus on a healthy eating style, not on dieting. Dieting usually lasts for a short amount of time and rarely produces long-term success. Think long term. You are developing new healthy behaviors to follow next month, in a year and in a decade.    This information is for educational purposes only and is not intended to replace  the advice of your doctor or health care provider. We encourage you to discuss with your doctor any questions or concerns you may have.        Guidelines for Losing Weight   We want weight loss that will last so you should lose 1-2 pounds a week.  THAT IS IT! Please pick THREE things a month to change. Once it is a habit check off the item. Then pick another three items off the list to become habits.  If you are already doing a habit on the list GREAT!  Cross that item off!  Dont drink your calories. Ie, alcohol, soda, fruit juice, and sweet tea.   Drink more water. Drink a glass when you feel hungry or before each meal.   Eat breakfast - Complex carb and protein (likeDannon light and fit yogurt, oatmeal, fruit, eggs, [place] bacon).  Measure your cereal.  Eat no more than one cup a day. (ie Kashi)  Eat an apple a day.  Add a vegetable a day.  Try a new vegetable a month.  Use Pam! Stop using oil or butter to cook.  Dont finish your plate or use smaller plates.  Share your dessert.  Eat sugar free Jello for dessert or frozen grapes.  Dont eat 2-3 hours before bed.  Switch to whole wheat bread, pasta, and brown rice.  Make healthier choices when you eat out. No fries!  Pick baked chicken, NOT fried.  Dont forget to SLOW DOWN when you eat. It is not going anywhere.   Take the stairs.  Park far away in the parking lot  Lift soup cans (or weights) for 10 minutes while watching TV.  Walk at work for 10 minutes during break.  Walk outside 1 time a week with your friend, kids, dog, or significant other.  Start a walking group at church.  Walk the mall as much as you can tolerate.   Keep a food diary.  Weigh yourself daily.  Walk for 15 minutes 3 days per week.  Cook at home more often and eat out less. If life happens and you go back to old habits, it is okay.  Just start over. You can do it!  If you experience chest pain, get short of breath, or tired  during the exercise, please stop immediately and inform your doctor.    Before you even begin to attack a weight-loss plan, it pays to remember this: You are not  fat. You have fat. Losing weight isn't about blame or shame; it's simply another achievement to accomplish. Dieting is like any other skill--you have to buckle down and work at it. As long as you act in a smart, reasonable way, you'll ultimately get where you want to be. Here are some weight loss pearls for you.   1. It's Not a Diet. It's a Lifestyle Thinking of a diet as something you're on and suffering through only for the short term doesn't work. To shed weight and keep it off, you need to make permanent changes to the way you eat. It's OK to indulge occasionally, of course, but if you cut calories temporarily and then revert to your old way of eating, you'll gain back the weight quicker than you can say yo-yo. Use it to lose it. Research shows that one of the best predictors of long-term weight loss is how many pounds you drop in the first month. For that reason, nutritionists often suggest being stricter for the first two weeks of your new eating strategy to build momentum. Cut out added sugar and alcohol and avoid unrefined carbs. After that, figure out how you can reincorporate them in a way that's healthy and maintainable.  2. There's a Right Way to Exercise Working out burns calories and fat and boosts your metabolism by building muscle. But those trying to lose weight are notorious for overestimating the number of calories they burn and underestimating the amount they take in. Unfortunately, your system is biologically programmed to hold on to extra pounds and that means when you start exercising, your body senses the deficit and ramps up its hunger signals. If you're not diligent, you'll eat everything you burn and then some. Use it, to lose it. Cardio gets all the exercise glory, but strength and interval training are the real  heroes. They help you build lean muscle, which in turn increases your metabolism and calorie-burning ability 3. Don't Overreact to Mild Hunger Some people have a hard time losing weight because of hunger anxiety. To them, being hungry is bad--something to be avoided at all costs--so they carry snacks with them and eat when they don't need to. Others eat because they're stressed out or bored. While you never want to get to the point of being ravenous (that's when bingeing is likely to happen), a hunger pang, a craving, or the fact that it's 3:00 p.m. should not send you racing for the vending machine or obsessing about the energy bar in your purse. Ideally, you should put off eating until your stomach is growling and it's difficult to concentrate.  Use it to lose it. When you feel the urge to eat, use the HALT method. Ask yourself, Am I really hungry? Or am I angry or anxious, lonely or bored, or tired? If you're still not certain, try the apple test. If you're truly hungry, an apple should seem delicious; if it doesn't, something else is going on. Or you can try drinking water and making yourself busy, if you are still hungry try a healthy snack.  4. Not All Calories Are Created Equal The mechanics of weight loss are pretty simple: Take in fewer calories than you use for energy. But the kind of food you eat makes all the difference. Processed food that's high in saturated fat and refined starch or sugar can cause inflammation that disrupts the hormone signals that tell your brain you're full. The result: You eat a lot more.  Use it to lose  it. Clean up your diet. Swap in whole, unprocessed foods, including vegetables, lean protein, and healthy fats that will fill you up and give you the biggest nutritional bang for your calorie buck. In a few weeks, as your brain starts receiving regular hunger and fullness signals once again, you'll notice that you feel less hungry overall and naturally start cutting back on  the amount you eat.  5. Protein, Produce, and Plant-Based Fats Are Your Weight-Loss Trinity Here's why eating the three Ps regularly will help you drop pounds. Protein fills you up. You need it to build lean muscle, which keeps your metabolism humming so that you can torch more fat. People in a weight-loss program who ate double the recommended daily allowance for protein (about 110 grams for a 150-pound woman) lost 70 percent of their weight from fat, while people who ate the RDA lost only about 40 percent, one study found. Produce is packed with filling fiber. "It's very difficult to consume too many calories if you're eating a lot of vegetables. Example: Three cups of broccoli is a lot of food, yet only 93 calories. (Fruit is another story. It can be easy to overeat and can contain a lot of calories from sugar, so be sure to monitor your intake.) Plant-based fats like olive oil and those in avocados and nuts are healthy and extra satiating.  Use it to lose it. Aim to incorporate each of the three Ps into every meal and snack. People who eat protein throughout the day are able to keep weight off, according to a study in the [place] of Clinical Nutrition. In addition to meat, poultry and seafood, good sources are beans, lentils, eggs, tofu, and yogurt. As for fat, keep portion sizes in check by measuring out salad dressing, oil, and nut butters (shoot for one to two tablespoons). Finally, eat veggies or a little fruit at every meal. People who did that consumed 308 fewer calories but didn't feel any hungrier than when they didn't eat more produce.  7. How You Eat Is As Important As What You Eat In order for your brain to register that you're full, you need to focus on what you're eating. Sit down whenever you eat, preferably at a table. Turn off the TV or computer, put down your phone, and look at your food. Smell it. Chew slowly, and don't put another bite on your fork until you swallow. When  women ate lunch this attentively, they consumed 30 percent less when snacking later than those who listened to an audiobook at lunchtime, according to a study in the [place] of Nutrition. 8. Weighing Yourself Really Works The scale provides the best evidence about whether your efforts are paying off. Seeing the numbers tick up or down or stagnate is motivation to keep going--or to rethink your approach. A 2015 study at [facility] found that daily weigh-ins helped people lose more weight, keep it off, and maintain that loss, even after two years. Use it to lose it. Step on the scale at the same time every day for the best results. If your weight shoots up several pounds from one weigh-in to the next, don't freak out. Eating a lot of salt the night before or having your period is the likely culprit. The number should return to normal in a day or two. It's a steady climb that you need to do something about. 9. Too Much Stress and Too Little Sleep Are Your Enemies When you're tired and frazzled,  your body cranks up the production of cortisol, the stress hormone that can cause carb cravings. Not getting enough sleep also boosts your levels of ghrelin, a hormone associated with hunger, while suppressing leptin, a hormone that signals fullness and satiety. People on a diet who slept only five and a half hours a night for two weeks lost 55 percent less fat and were hungrier than those who slept eight and a half hours, according to a study in the [place]. Use it to lose it. Prioritize sleep, aiming for seven hours or more a night, which research shows helps lower stress. And make sure you're getting quality zzz's. If a snoring spouse or a fidgety cat wakes you up frequently throughout the night, you may end up getting the equivalent of just four hours of sleep, according to a study from [facility]. Keep pets out of the bedroom, and use a white-noise app to drown out  snoring. 10. You Will Hit a plateau--And You Can Bust Through It As you slim down, your body releases much less leptin, the fullness hormone.  If you're not strength training, start right now. Building muscle can raise your metabolism to help you overcome a plateau. To keep your body challenged and burning calories, incorporate new moves and more intense intervals into your workouts or add another sweat session to your weekly routine. Alternatively, cut an extra 100 calories or so a day from your diet. Now that you've lost weight, your body simply doesn't need as much fuel.    Since food equals calories, in order to lose weight you must either eat fewer calories, exercise more to burn off calories with activity, or both. Food that is not used to fuel the body is stored as fat. A major component of losing weight is to make smarter food choices. Here's how:  1)   Limit non-nutritious foods, such as: Sugar, honey, syrups and candy Pastries, donuts, pies, cakes and cookies Soft drinks, sweetened juices and alcoholic beverages  2)  Cut down on high-fat foods by: - Choosing poultry, fish or lean red meat - Choosing low-fat cooking methods, such as baking, broiling, steaming, grilling and boiling - Using low-fat or non-fat dairy products - Using vinaigrette, herbs, lemon or fat-free salad dressings - Avoiding fatty meats, such as bacon, sausage, franks, ribs and luncheon meats - Avoiding high-fat snacks like nuts, chips and chocolate - Avoiding fried foods - Using less butter, margarine, oil and mayonnaise - Avoiding high-fat gravies, cream sauces and cream-based soups  3) Eat a variety of foods, including: - Fruit and vegetables that are raw, steamed or baked - Whole grains, breads, cereal, rice and pasta - Dairy products, such as low-fat or non-fat milk or yogurt, low-fat cottage cheese and low-fat cheese - Protein-rich foods like chicken, [place], fish, lean meat and legumes, or beans  4)  Change your eating habits by: - Eat three balanced meals a day to help control your hunger - Watch portion sizes and eat small servings of a variety of foods - Choose low-calorie snacks - Eat only when you are hungry and stop when you are satisfied - Eat slowly and try not to perform other tasks while eating - Find other activities to distract you from food, such as walking, taking up a hobby or being involved in the community - Include regular exercise in your daily routine ( minimum of 20 min of moderate-intensity exercise at least 5 days/week)  - Find a support group,  if necessary, for emotional support in your weight loss journey           Easy ways to cut 100 calories   1. Eat your eggs with hot sauce OR salsa instead of cheese.  Eggs are great for breakfast, but many people consider eggs and cheese to be BFFs. Instead of cheese--1 oz. of cheddar has 114 calories--top your eggs with hot sauce, which contains no calories and helps with satiety and metabolism. Salsa is also a great option!!  2. Top your toast, waffles or pancakes with fresh berries instead of jelly or syrup. Half a cup of berries--fresh, frozen or thawed--has about 40 calories, compared with 2 tbsp. of maple syrup or jelly, which both have about 100 calories. The berries will also give you a good punch of fiber, which helps keep you full and satisfied and wont spike blood sugar quickly like the jelly or syrup. 3. Swap the non-fat latte for black coffee with a splash of half-and-half. Contrary to its name, that non-fat latte has 130 calories and a startling 19g of carbohydrates per 16 oz. serving. Replacing that light drinkable dessert with a black coffee with a splash of half-and-half saves you more than 100 calories per 16 oz. serving. 4. Sprinkle salads with freeze-dried raspberries instead of dried cranberries. If you want a sweet addition to your nutritious salad, stay away from dried cranberries. They have a  whopping 130 calories per  cup and 30g carbohydrates. Instead, sprinkle freeze-dried raspberries guilt-free and save more than 100 calories per  cup serving, adding 3g of belly-filling fiber. 5. Go for mustard in place of mayo on your sandwich. Mustard can add really nice flavor to any sandwich, and there are tons of varieties, from spicy to honey. A serving of mayo is 95 calories, versus 10 calories in a serving of mustard.  Or try an avocado mayo spread: You can find the recipe few click this link: https://www.californiaavocado.com/recipes/recipe-container/[place]-avocado-mayo 6. Choose a DIY salad dressing instead of the store-bought kind. Mix Dijon or whole grain mustard with low-fat Kefir or red wine vinegar and garlic. 7. Use hummus as a spread instead of a dip. Use hummus as a spread on a high-fiber cracker or tortilla with a sandwich and save on calories without sacrificing taste. 8. Pick just one salad accessory. Salad isnt automatically a calorie winner. Its easy to over-accessorize with toppings. Instead of topping your salad with nuts, avocado and cranberries (all three will clock in at 313 calories), just pick one. The next day, choose a different accessory, which will also keep your salad interesting. You dont wear all your jewelry every day, right? 9. Ditch the white pasta in favor of spaghetti squash. One cup of cooked spaghetti squash has about 40 calories, compared with traditional spaghetti, which comes with more than 200. Spaghetti squash is also nutrient-dense. Its a good source of fiber and Vitamins A and C, and it can be eaten just like you would eat pasta--with a great tomato sauce and [place] meatballs or with pesto, tofu and spinach, for example. 10. Dress up your chili, soups and stews with non-fat [place] yogurt instead of sour cream. Just a dollop of sour cream can set you back 115 calories and a whopping 12g of fat--seven of which are of the artery-clogging  variety. Added bonus: [place] yogurt is packed with muscle-building protein, calcium and B Vitamins. 11. Mash cauliflower instead of mashed potatoes. One cup of traditional mashed potatoes--in all their creamy goodness--has more than 200  calories, compared to mashed cauliflower, which you can typically eat for less than 100 calories per 1 cup serving. Cauliflower is a great source of the antioxidant indole-3-carbinol (I3C), which may help reduce the risk of some cancers, like breast cancer. 12. Ditch the ice cream sundae in favor of a [place] yogurt parfait. Instead of a cup of ice cream or fro-yo for dessert, try 1 cup of nonfat Greek yogurt topped with fresh berries and a sprinkle of cacao nibs. Both toppings are packed with antioxidants, which can help reduce cellular inflammation and oxidative damage. And the comparison is a no-brainer: One cup of ice cream has about 275 calories; one cup of frozen yogurt has about 230; and a cup of Greek yogurt has just 130, plus twice the protein, so youre less likely to return to the freezer for a second helping. 13. Put olive oil in a spray container instead of using it directly from the bottle. Each tablespoon of olive oil is 120 calories and 15g of fat. Use a mister instead of pouring it straight into the pan or onto a salad. This allows for portion control and will save you more than 100 calories. 14. When baking, substitute canned pumpkin for butter or oil. Canned pumpkin--not pumpkin pie mix--is loaded with Vitamin A, which is important for skin and eye health, as well as immunity. And the comparisons are pretty crazy:  cup of canned pumpkin has about 40 calories, compared to butter or oil, which has more than 800 calories. Yes, 800 calories. Applesauce and mashed banana can also serve as good substitutions for butter or oil, usually in a 1:1 ratio. 15. Top casseroles with high-fiber cereal instead of breadcrumbs. Breadcrumbs are typically made with white bread,  while breakfast cereals contain 5-9g of fiber per serving. Not only will you save more than 150 calories per  cup serving, the swap will also keep you more full and youll get a metabolism boost from the added fiber. 16. Snack on pistachios instead of macadamia nuts. Believe it or not, you get the same amount of calories from 35 pistachios (100 calories) as you would from only five macadamia nuts. 17. Chow down on kale chips rather than potato chips. This is my favorite dont knock it till you try it swap. Kale chips are so easy to make at home, and you can spice them up with a little grated parmesan or chili powder. Plus, theyre a mere fraction of the calories of potato chips, but with the same crunch factor we crave so often. 18. Add seltzer and some fruit slices to your cocktail instead of soda or fruit juice. One cup of soda or fruit juice can pack on as much as 140 calories. Instead, use seltzer and fruit slices. The fruit provides valuable phytochemicals, such as flavonoids and anthocyanins, which help to combat cancer and stave off the aging process.             How to Increase Your Level of Physical Activity  Getting regular physical activity is important for your overall health and well-being. Most people do not get enough exercise. There are easy ways to increase your level of physical activity, even if you have not been very active in the past or you are just starting out. Why is physical activity important? Physical activity has many short-term and long-term health benefits. Regular exercise can:  Help you lose weight or maintain a healthy weight.  Strengthen your muscles and bones.  Boost your mood and  improve self-esteem.  Reduce your risk of certain long-term (chronic) diseases, like heart disease, cancer, and diabetes.  Help you stay capable of walking and moving around (mobile) as you age.  Prevent accidents, such as falls, as you age.  Increase life  expectancy.  What are the benefits of being physically active on a regular basis? In addition to improving your physical health, being physically active on most days of the week can help you in ways that you may not expect. Benefits of regular physical activity may include:  Feeling good about your body.  Being able to move around more easily and for longer periods of time without getting tired (increased stamina).  Finding new sources of fun and enjoyment.  Meeting new people who share a common interest.  Being able to fight off illness better (enhanced immunity).  Being able to sleep better.  What can happen if I am not physically active on a regular basis? Not getting enough physical activity can lead to an unhealthy lifestyle and future health problems. This can increase your chances of:  Becoming overweight or obese.  Becoming sick.  Developing chronic illnesses, like heart disease or diabetes.  Having mental health problems, like depression or anxiety.  Having sleep problems.  Having trouble walking or getting yourself around (reduced mobility).  Injuring yourself in a fall as you get older.  What steps can I take to be more physically active?  Check with your health care provider about how to get started. Ask your health care provider what activities are safe for you.  Start out slowly. Walking or doing some simple chair exercises is a good place to start, especially if you have not been active before or for a long time.  Try to find activities that you enjoy. You are more likely to commit to an exercise routine if it does not feel like a chore.  If you have bone or joint problems, choose low-impact exercises, like walking or swimming.  Include physical activity in your everyday routine.  Invite friends or family members to exercise with you. This also will help you commit to your workout plan.  Set goals that you can work toward.  Aim for at least 150 minutes  of moderate-intensity exercise each week. Examples of moderate-intensity exercise include walking or riding a bike. Where to find more information:  Centers for Disease Control and Prevention: BowlingGrip.is  [employer] on [place] www.http://villegas.org/  ChooseMyPlate: WirelessMortgages.dk Contact a health care provider if:  You have headaches, muscle aches, or joint pain.  You feel dizzy or light-headed while exercising.  You faint.  You have chest pain while exercising. Summary  Exercise benefits your mind and body at any age, even if you are just starting out.  If you have a chronic illness or have not been active for a while, check with your health care provider before increasing your physical activity.  Choose activities that are safe and enjoyable for you.Ask your health care provider what activities are safe for you.  Start slowly. Tell your health care provider if you have problems as you start to increase your activity level. This information is not intended to replace advice given to you by your health care provider. Make sure you discuss any questions you have with your health care provider. Document Released: 11/15/2016 Document Revised: 11/15/2016 Document Reviewed: 11/15/2016 Elsevier Interactive Patient Education  [employer].

## 2019-06-01 DIAGNOSIS — H401131 Primary open-angle glaucoma, bilateral, mild stage: Secondary | ICD-10-CM | POA: Diagnosis not present

## 2019-06-02 ENCOUNTER — Telehealth: Payer: Self-pay | Admitting: Family Medicine

## 2019-06-02 NOTE — Telephone Encounter (Signed)
If the rash has changed, she will need a appointment for me to look at it and reassess-rather than Lyme's, I be more concerned with infection-especially if she has been scratching it etc.    Last time I briefly saw her rash, it was not concerning for Lyme's disease nor did she have constitutional symptoms

## 2019-06-02 NOTE — Telephone Encounter (Signed)
Spoke to the patient and she states that the area is looking better today.  Patient will follow up tomorrow if appointment is needed. MPulliam, CMA/RT(R)

## 2019-06-02 NOTE — Telephone Encounter (Signed)
Please advise. MPulliam, CMA/RT(R)  

## 2019-06-02 NOTE — Telephone Encounter (Signed)
Patient called says @ her NP appt she showed Dr. Jenetta Downer a tick bite on Lt Shoulder that is now swollen, feverish & red----- pt worried about catching Lyme Disease .   --Forwarding message to medical assistant for review w/provider & to advise if Telehealth needed.  --glh

## 2019-06-09 ENCOUNTER — Other Ambulatory Visit: Payer: Medicare HMO

## 2019-06-09 ENCOUNTER — Other Ambulatory Visit: Payer: Self-pay

## 2019-06-09 DIAGNOSIS — K6289 Other specified diseases of anus and rectum: Secondary | ICD-10-CM | POA: Diagnosis not present

## 2019-06-09 DIAGNOSIS — E785 Hyperlipidemia, unspecified: Secondary | ICD-10-CM | POA: Diagnosis not present

## 2019-06-09 DIAGNOSIS — K5909 Other constipation: Secondary | ICD-10-CM | POA: Diagnosis not present

## 2019-06-09 DIAGNOSIS — M7072 Other bursitis of hip, left hip: Secondary | ICD-10-CM

## 2019-06-09 DIAGNOSIS — R7303 Prediabetes: Secondary | ICD-10-CM

## 2019-06-09 DIAGNOSIS — M7071 Other bursitis of hip, right hip: Secondary | ICD-10-CM

## 2019-06-09 DIAGNOSIS — F339 Major depressive disorder, recurrent, unspecified: Secondary | ICD-10-CM

## 2019-06-09 DIAGNOSIS — F411 Generalized anxiety disorder: Secondary | ICD-10-CM | POA: Diagnosis not present

## 2019-06-09 DIAGNOSIS — Z6834 Body mass index (BMI) 34.0-34.9, adult: Secondary | ICD-10-CM

## 2019-06-09 DIAGNOSIS — R030 Elevated blood-pressure reading, without diagnosis of hypertension: Secondary | ICD-10-CM

## 2019-06-09 DIAGNOSIS — E559 Vitamin D deficiency, unspecified: Secondary | ICD-10-CM | POA: Diagnosis not present

## 2019-06-10 LAB — CBC WITH DIFFERENTIAL/PLATELET
Basophils Absolute: 0 10*3/uL (ref 0.0–0.2)
Basos: 1 %
EOS (ABSOLUTE): 0.2 10*3/uL (ref 0.0–0.4)
Eos: 4 %
Hematocrit: 40.3 % (ref 34.0–46.6)
Hemoglobin: 13.3 g/dL (ref 11.1–15.9)
Immature Grans (Abs): 0 10*3/uL (ref 0.0–0.1)
Immature Granulocytes: 0 %
Lymphocytes Absolute: 2 10*3/uL (ref 0.7–3.1)
Lymphs: 38 %
MCH: 28.6 pg (ref 26.6–33.0)
MCHC: 33 g/dL (ref 31.5–35.7)
MCV: 87 fL (ref 79–97)
Monocytes Absolute: 0.5 10*3/uL (ref 0.1–0.9)
Monocytes: 9 %
Neutrophils Absolute: 2.6 10*3/uL (ref 1.4–7.0)
Neutrophils: 48 %
Platelets: 189 10*3/uL (ref 150–450)
RBC: 4.65 x10E6/uL (ref 3.77–5.28)
RDW: 13.8 % (ref 11.7–15.4)
WBC: 5.3 10*3/uL (ref 3.4–10.8)

## 2019-06-10 LAB — COMPREHENSIVE METABOLIC PANEL
ALT: 14 IU/L (ref 0–32)
AST: 12 IU/L (ref 0–40)
Albumin/Globulin Ratio: 2.3 — ABNORMAL HIGH (ref 1.2–2.2)
Albumin: 4.2 g/dL (ref 3.8–4.8)
Alkaline Phosphatase: 65 IU/L (ref 39–117)
BUN/Creatinine Ratio: 30 — ABNORMAL HIGH (ref 12–28)
BUN: 20 mg/dL (ref 8–27)
Bilirubin Total: 0.3 mg/dL (ref 0.0–1.2)
CO2: 21 mmol/L (ref 20–29)
Calcium: 9.2 mg/dL (ref 8.7–10.3)
Chloride: 107 mmol/L — ABNORMAL HIGH (ref 96–106)
Creatinine, Ser: 0.66 mg/dL (ref 0.57–1.00)
GFR calc Af Amer: 105 mL/min/{1.73_m2} (ref >59)
GFR calc non Af Amer: 91 mL/min/{1.73_m2} (ref >59)
Globulin, Total: 1.8 g/dL (ref 1.5–4.5)
Glucose: 96 mg/dL (ref 65–99)
Potassium: 5.2 mmol/L (ref 3.5–5.2)
Sodium: 143 mmol/L (ref 134–144)
Total Protein: 6 g/dL (ref 6.0–8.5)

## 2019-06-10 LAB — LIPID PANEL
Chol/HDL Ratio: 3.8 ratio (ref 0.0–4.4)
Cholesterol, Total: 233 mg/dL — ABNORMAL HIGH (ref 100–199)
HDL: 61 mg/dL (ref >39)
LDL Calculated: 133 mg/dL — ABNORMAL HIGH (ref 0–99)
Triglycerides: 196 mg/dL — ABNORMAL HIGH (ref 0–149)
VLDL Cholesterol Cal: 39 mg/dL (ref 5–40)

## 2019-06-10 LAB — HEMOGLOBIN A1C
Est. average glucose Bld gHb Est-mCnc: 114 mg/dL
Hgb A1c MFr Bld: 5.6 % (ref 4.8–5.6)

## 2019-06-10 LAB — VITAMIN D 25 HYDROXY (VIT D DEFICIENCY, FRACTURES): Vit D, 25-Hydroxy: 35.4 ng/mL (ref 30.0–100.0)

## 2019-06-10 LAB — TSH: TSH: 4.07 u[IU]/mL (ref 0.450–4.500)

## 2019-06-10 LAB — T4, FREE: Free T4: 0.92 ng/dL (ref 0.82–1.77)

## 2019-06-11 ENCOUNTER — Ambulatory Visit (INDEPENDENT_AMBULATORY_CARE_PROVIDER_SITE_OTHER): Payer: Medicare HMO | Admitting: Family Medicine

## 2019-06-11 ENCOUNTER — Other Ambulatory Visit: Payer: Self-pay

## 2019-06-11 ENCOUNTER — Encounter: Payer: Self-pay | Admitting: Family Medicine

## 2019-06-11 VITALS — BP 127/82 | Ht 63.0 in | Wt 195.0 lb

## 2019-06-11 DIAGNOSIS — F411 Generalized anxiety disorder: Secondary | ICD-10-CM

## 2019-06-11 DIAGNOSIS — E559 Vitamin D deficiency, unspecified: Secondary | ICD-10-CM

## 2019-06-11 DIAGNOSIS — M7061 Trochanteric bursitis, right hip: Secondary | ICD-10-CM | POA: Diagnosis not present

## 2019-06-11 DIAGNOSIS — E781 Pure hyperglyceridemia: Secondary | ICD-10-CM

## 2019-06-11 DIAGNOSIS — F339 Major depressive disorder, recurrent, unspecified: Secondary | ICD-10-CM

## 2019-06-11 DIAGNOSIS — R7303 Prediabetes: Secondary | ICD-10-CM

## 2019-06-11 DIAGNOSIS — R030 Elevated blood-pressure reading, without diagnosis of hypertension: Secondary | ICD-10-CM

## 2019-06-11 DIAGNOSIS — E785 Hyperlipidemia, unspecified: Secondary | ICD-10-CM

## 2019-06-11 DIAGNOSIS — M25552 Pain in left hip: Secondary | ICD-10-CM | POA: Diagnosis not present

## 2019-06-11 DIAGNOSIS — M7062 Trochanteric bursitis, left hip: Secondary | ICD-10-CM | POA: Diagnosis not present

## 2019-06-11 DIAGNOSIS — M25551 Pain in right hip: Secondary | ICD-10-CM | POA: Diagnosis not present

## 2019-06-11 NOTE — Progress Notes (Signed)
Virtual / live video office visit note for [employer], D.O- Primary Care Physician at [facility]   I connected with current patient today and beyond visually recognizing the correct individual, I verified that I am speaking with the correct person using two identifiers.   Location of the patient: Home  Location of the provider: Office Only the patient (+/- their family members at pt's discretion) and myself were participating in the encounter    - This visit type was conducted due to national recommendations for restrictions regarding the COVID-19 Pandemic (e.g. social distancing) in an effort to limit this patient's exposure and mitigate transmission in our community.  This format is felt to be most appropriate for this patient at this time.   - The patient did have access to video technology today yet, we had technical difficulties with this method, requiring transitioning to audio only.    - No physical exam could be performed with this format, beyond that communicated to [place] by the patient/ family members as noted.   - Additionally my office staff/ schedulers discussed with the patient that there may be a monetary charge related to this service, depending on patient's medical insurance.   The patient expressed understanding, and agreed to proceed.      History of Present Illness:  Patient had a new patient visit with me on 05/28/2019 recently.  She is here to follow-up on labs and discussed them.    Recently had full set of fasting blood work.  Done on 06/09/2019.  Here to discuss that.  BP running around 125/83 at home.  No sx at all  The 10-year ASCVD risk score Mikey Bussing DC Brooke Bonito., et al., 2013) is: 7.8%   Values used to calculate the score:     Age: 30 years     Sex: Female     Is Non-Hispanic African American: No     Diabetic: No     Tobacco smoker: No     Systolic Blood Pressure: 628 mmHg     Is BP treated: No     HDL Cholesterol: 61 mg/dL     Total Cholesterol: 233  mg/dL    Seen by Alexia Freestone- saw for hip bursitis; starting [facility] in [place]   Recent Results (from the past 2160 hour(s))  TSH     Status: None   Collection Time: 06/09/19  9:54 AM  Result Value Ref Range   TSH 4.070 0.450 - 4.500 uIU/mL  VITAMIN D 25 Hydroxy (Vit-D Deficiency, Fractures)     Status: None   Collection Time: 06/09/19  9:54 AM  Result Value Ref Range   Vit D, 25-Hydroxy 35.4 30.0 - 100.0 ng/mL    Comment: Vitamin D deficiency has been defined by the [place] and an Endocrine Society practice guideline as a level of serum 25-OH vitamin D less than 20 ng/mL (1,2). The Endocrine Society went on to further define vitamin D insufficiency as a level between 21 and 29 ng/mL (2). 1. IOM (Institute of Medicine). 2010. Dietary reference    intakes for calcium and D. [place]: The    [employer]. 2. Holick MF, Binkley [place], Bischoff-Ferrari HA, et al.    Evaluation, treatment, and prevention of vitamin D    deficiency: an Endocrine Society clinical practice    guideline. JCEM. 2011 Jul; 96(7):1911-30.   T4, free     Status: None   Collection Time: 06/09/19  9:54 AM  Result Value Ref Range   Free T4 0.92 0.82 - 1.77 ng/dL  Lipid panel     Status: Abnormal   Collection Time: 06/09/19  9:54 AM  Result Value Ref Range   Cholesterol, Total 233 (H) 100 - 199 mg/dL   Triglycerides 196 (H) 0 - 149 mg/dL   HDL 61 >39 mg/dL   VLDL Cholesterol Cal 39 5 - 40 mg/dL   LDL Calculated 133 (H) 0 - 99 mg/dL   Chol/HDL Ratio 3.8 0.0 - 4.4 ratio    Comment:                                   T. Chol/HDL Ratio                                             Men  Women                               1/2 Avg.Risk  3.4    3.3                                   Avg.Risk  5.0    4.4                                2X Avg.Risk  9.6    7.1                                3X Avg.Risk 23.4   11.0   Hemoglobin A1c     Status: None   Collection Time:  06/09/19  9:54 AM  Result Value Ref Range   Hgb A1c MFr Bld 5.6 4.8 - 5.6 %    Comment:          Prediabetes: 5.7 - 6.4          Diabetes: >6.4          Glycemic control for adults with diabetes: <7.0    Est. average glucose Bld gHb Est-mCnc 114 mg/dL  Comprehensive metabolic panel     Status: Abnormal   Collection Time: 06/09/19  9:54 AM  Result Value Ref Range   Glucose 96 65 - 99 mg/dL   BUN 20 8 - 27 mg/dL   Creatinine, Ser 0.66 0.57 - 1.00 mg/dL   GFR calc non Af Amer 91 >59 mL/min/1.73   GFR calc Af Amer 105 >59 mL/min/1.73   BUN/Creatinine Ratio 30 (H) 12 - 28   Sodium 143 134 - 144 mmol/L   Potassium 5.2 3.5 - 5.2 mmol/L   Chloride 107 (H) 96 - 106 mmol/L   CO2 21 20 - 29 mmol/L   Calcium 9.2 8.7 - 10.3 mg/dL   Total Protein 6.0 6.0 - 8.5 g/dL   Albumin 4.2 3.8 - 4.8 g/dL   Globulin, Total 1.8 1.5 - 4.5 g/dL   Albumin/Globulin Ratio 2.3 (H) 1.2 - 2.2   Bilirubin Total 0.3 0.0 - 1.2 mg/dL   Alkaline Phosphatase 65 39 - 117 IU/L   AST 12 0 - 40 IU/L  ALT 14 0 - 32 IU/L  CBC with Differential/Platelet     Status: None   Collection Time: 06/09/19  9:54 AM  Result Value Ref Range   WBC 5.3 3.4 - 10.8 x10E3/uL   RBC 4.65 3.77 - 5.28 x10E6/uL   Hemoglobin 13.3 11.1 - 15.9 g/dL   Hematocrit 40.3 34.0 - 46.6 %   MCV 87 79 - 97 fL   MCH 28.6 26.6 - 33.0 pg   MCHC 33.0 31.5 - 35.7 g/dL   RDW 13.8 11.7 - 15.4 %   Platelets 189 150 - 450 x10E3/uL   Neutrophils 48 Not Estab. %   Lymphs 38 Not Estab. %   Monocytes 9 Not Estab. %   Eos 4 Not Estab. %   Basos 1 Not Estab. %   Neutrophils Absolute 2.6 1.4 - 7.0 x10E3/uL   Lymphocytes Absolute 2.0 0.7 - 3.1 x10E3/uL   Monocytes Absolute 0.5 0.1 - 0.9 x10E3/uL   EOS (ABSOLUTE) 0.2 0.0 - 0.4 x10E3/uL   Basophils Absolute 0.0 0.0 - 0.2 x10E3/uL   Immature Granulocytes 0 Not Estab. %   Immature Grans (Abs) 0.0 0.0 - 0.1 x10E3/uL     Impression and Recommendations:    1. Prediabetes   2. Hyperlipidemia, unspecified  hyperlipidemia type   3. Hypertriglyceridemia   4. Elevated blood pressure reading   5. Depression, recurrent ([place])   6. GAD (generalized anxiety disorder)   7. Vitamin D insufficiency    1) pre-DM:   5.6 a1c ;    Several yrs ago 5.8 great   2)  Inc ldl & tg:    Pt's 10 yr risk is over 7.5%  At 7.8 but she wishes to change diet/ exercise first and reck levels 26mo.     Educated pt  3)  bp-  good control  4)   Vit D- inc supp, reck 23mo   4)  Mood-  Good control, RF as needed - As part of my medical decision making, I reviewed the following data within the [place] History obtained from pt /family, CMA notes reviewed and incorporated if applicable, Labs reviewed, Radiograph/ tests reviewed if applicable and OV notes from prior OV's with me, as well as other specialists she/he has seen since seeing me last, were all reviewed and used in my medical decision making process today.   - Additionally, discussion had with patient regarding txmnt plan, their biases about that plan etc were used in my medical decision making today.   - The patient agreed with the plan and demonstrated an understanding of the instructions.   No barriers to understanding were identified.   - Red flag symptoms and signs discussed in detail.  Patient expressed understanding regarding what to do in case of emergency\ urgent symptoms.  The patient was advised to call back or seek an in-person evaluation if the symptoms worsen or if the condition fails to improve as anticipated.   Return for FLP, vit D in 4 mo, then oV.    Medications Discontinued During This Encounter  Medication Reason   aspirin 325 MG tablet Discontinued by provider     I provided *22 minutes of non-face-to-face time during this encounter,with over 50% of the time in direct counseling on patients medical conditions/ medical concerns.  Additional time was spent with charting and coordination of care after the actual visit commenced.    Note:  This note was prepared with assistance of Dragon voice recognition software. Occasional wrong-word or sound-a-like  substitutions may have occurred due to the inherent limitations of voice recognition software.  Deborah Dance, DO  Pt was evaluated by me in today for 32.5+ minutes, with over 50% time spent in face to face counseling of patients various medical conditions, treatment plans of those medical conditions including medicine management and lifestyle modification, strategies to improve health and well being; and in coordination of care. SEE ABOVE TREATMENT PLAN FOR DETAILS   Patient Care Team    Relationship Specialty Notifications Start End  Deborah Dance, DO PCP - General Family Medicine  05/21/19   Tamsen Roers, MD Referring Physician Family Medicine  05/21/19     -Vitals obtained; medications/ allergies reconciled;  personal medical, social, Sx etc.histories were updated by CMA, reviewed by me and are reflected in chart  Patient Active Problem List   Diagnosis Date Noted   Prediabetes 05/28/2019    Priority: High   HLD (hyperlipidemia) 05/28/2019    Priority: High   Elevated blood pressure reading 05/28/2019    Priority: High   Bilateral hip bursitis 05/28/2019    Priority: Medium   Depression, recurrent ([place]) 05/28/2019    Priority: Medium   GAD (generalized anxiety disorder) with panic disorder 05/28/2019    Priority: Medium   Chronic constipation- yrs and yrs.  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   Calcium Carbonate (CALCIUM 600 PO) Take 1,200 mg by mouth.   celecoxib (CELEBREX) 200 MG capsule Take 200 mg by  mouth daily.   cetirizine (ZYRTEC) 10 MG tablet Take 10 mg by mouth daily.   latanoprost (XALATAN) 0.005 % ophthalmic solution    Omega-3 Fatty Acids (FISH OIL PO) Take 1,000 mg by mouth daily.   polyethylene glycol (MIRALAX / GLYCOLAX) packet Take 17 g by mouth daily as needed.   RESTASIS 0.05 % ophthalmic emulsion INSTILL 1 DROP INTO EACH EYE TWICE DAILY   timolol (TIMOPTIC) 0.5 % ophthalmic solution    TRIAMCINOLONE ACETONIDE, TOP, 0.05 % OINT Apply topically.   vitamin B-12 (CYANOCOBALAMIN) 1000 MCG tablet Take 1,000 mcg by mouth daily.   [DISCONTINUED] sertraline (ZOLOFT) 100 MG tablet Take 100 mg by mouth daily.    Current Facility-Administered Medications for the 06/11/19 encounter (Office Visit) with Deborah Dance, DO  Medication   betamethasone acetate-betamethasone sodium phosphate (CELESTONE) injection 3 mg     No Known Allergies   ROS:  See above HPI for pertinent positives and negatives   Objective:   Blood pressure 127/82, height 5\' 3"  (1.6 m), weight 195 lb (88.5 kg).  (if some vitals are omitted, this means that patient was UNABLE to obtain them even though they were asked to get them prior to OV today.  They were asked to call us at their earliest convenience with these once obtained.)  General: A & O * 3; visually in no acute distress; in usual state of health.  Skin: Visible skin appears normal and pt's usual skin color HEENT:  EOMI, head is normocephalic and atraumatic.  Sclera are anicteric. Neck has a good range of motion.  Lips are noncyanotic Chest: normal chest excursion and movement Respiratory: speaking in full sentences, no  conversational dyspnea; no use of accessory muscles Psych: insight good, mood- appears full

## 2019-06-17 DIAGNOSIS — M7062 Trochanteric bursitis, left hip: Secondary | ICD-10-CM | POA: Diagnosis not present

## 2019-06-17 DIAGNOSIS — M7061 Trochanteric bursitis, right hip: Secondary | ICD-10-CM | POA: Diagnosis not present

## 2019-06-17 DIAGNOSIS — R2681 Unsteadiness on feet: Secondary | ICD-10-CM | POA: Diagnosis not present

## 2019-06-23 DIAGNOSIS — R2681 Unsteadiness on feet: Secondary | ICD-10-CM | POA: Diagnosis not present

## 2019-06-23 DIAGNOSIS — I10 Essential (primary) hypertension: Secondary | ICD-10-CM | POA: Diagnosis not present

## 2019-06-23 DIAGNOSIS — M7062 Trochanteric bursitis, left hip: Secondary | ICD-10-CM | POA: Diagnosis not present

## 2019-06-23 DIAGNOSIS — M7061 Trochanteric bursitis, right hip: Secondary | ICD-10-CM | POA: Diagnosis not present

## 2019-06-24 DIAGNOSIS — K5901 Slow transit constipation: Secondary | ICD-10-CM | POA: Diagnosis not present

## 2019-06-26 DIAGNOSIS — I10 Essential (primary) hypertension: Secondary | ICD-10-CM | POA: Diagnosis not present

## 2019-06-26 DIAGNOSIS — M7061 Trochanteric bursitis, right hip: Secondary | ICD-10-CM | POA: Diagnosis not present

## 2019-06-26 DIAGNOSIS — M7062 Trochanteric bursitis, left hip: Secondary | ICD-10-CM | POA: Diagnosis not present

## 2019-06-26 DIAGNOSIS — R2681 Unsteadiness on feet: Secondary | ICD-10-CM | POA: Diagnosis not present

## 2019-06-30 DIAGNOSIS — R2681 Unsteadiness on feet: Secondary | ICD-10-CM | POA: Diagnosis not present

## 2019-06-30 DIAGNOSIS — M7061 Trochanteric bursitis, right hip: Secondary | ICD-10-CM | POA: Diagnosis not present

## 2019-06-30 DIAGNOSIS — M7062 Trochanteric bursitis, left hip: Secondary | ICD-10-CM | POA: Diagnosis not present

## 2019-07-02 ENCOUNTER — Telehealth: Payer: Self-pay | Admitting: Family Medicine

## 2019-07-02 DIAGNOSIS — M7061 Trochanteric bursitis, right hip: Secondary | ICD-10-CM | POA: Diagnosis not present

## 2019-07-02 DIAGNOSIS — R2681 Unsteadiness on feet: Secondary | ICD-10-CM | POA: Diagnosis not present

## 2019-07-02 DIAGNOSIS — M7062 Trochanteric bursitis, left hip: Secondary | ICD-10-CM | POA: Diagnosis not present

## 2019-07-02 NOTE — Telephone Encounter (Signed)
Patient is requesting a refill of her Zoloft, she is requesting a 3 mnth supply per her insurance guideline. If approved please send to [facility] please

## 2019-07-06 DIAGNOSIS — R2681 Unsteadiness on feet: Secondary | ICD-10-CM | POA: Diagnosis not present

## 2019-07-06 DIAGNOSIS — M7062 Trochanteric bursitis, left hip: Secondary | ICD-10-CM | POA: Diagnosis not present

## 2019-07-06 DIAGNOSIS — M7061 Trochanteric bursitis, right hip: Secondary | ICD-10-CM | POA: Diagnosis not present

## 2019-07-07 ENCOUNTER — Other Ambulatory Visit: Payer: Self-pay

## 2019-07-07 DIAGNOSIS — K5901 Slow transit constipation: Secondary | ICD-10-CM | POA: Diagnosis not present

## 2019-07-07 DIAGNOSIS — N393 Stress incontinence (female) (male): Secondary | ICD-10-CM | POA: Diagnosis not present

## 2019-07-07 DIAGNOSIS — M62838 Other muscle spasm: Secondary | ICD-10-CM | POA: Diagnosis not present

## 2019-07-07 DIAGNOSIS — M6289 Other specified disorders of muscle: Secondary | ICD-10-CM | POA: Diagnosis not present

## 2019-07-07 DIAGNOSIS — M6281 Muscle weakness (generalized): Secondary | ICD-10-CM | POA: Diagnosis not present

## 2019-07-07 MED ORDER — SERTRALINE HCL 100 MG PO TABS: 100.0000 mg | ORAL_TABLET | Freq: Every day | ORAL | 0 refills | Status: DC

## 2019-07-07 NOTE — Telephone Encounter (Signed)
Patient is requesting refill on Zoloft, medication was last filled by a pervious provider.  LOV 06/11/2019. Please review and advise. MPulliam, CMA/RT(R)

## 2019-07-07 NOTE — Telephone Encounter (Signed)
Medication last filled by a pervious provider - sent request to Dr. Raliegh Scarlet to review. MPulliam, CMA/RT(R)

## 2019-07-08 ENCOUNTER — Other Ambulatory Visit: Payer: Self-pay

## 2019-07-08 ENCOUNTER — Ambulatory Visit: Payer: Medicare HMO | Admitting: Family Medicine

## 2019-07-08 DIAGNOSIS — R2681 Unsteadiness on feet: Secondary | ICD-10-CM | POA: Diagnosis not present

## 2019-07-08 DIAGNOSIS — M7061 Trochanteric bursitis, right hip: Secondary | ICD-10-CM | POA: Diagnosis not present

## 2019-07-08 DIAGNOSIS — M7062 Trochanteric bursitis, left hip: Secondary | ICD-10-CM | POA: Diagnosis not present

## 2019-07-13 DIAGNOSIS — M7061 Trochanteric bursitis, right hip: Secondary | ICD-10-CM | POA: Diagnosis not present

## 2019-07-13 DIAGNOSIS — M7062 Trochanteric bursitis, left hip: Secondary | ICD-10-CM | POA: Diagnosis not present

## 2019-07-13 DIAGNOSIS — R2681 Unsteadiness on feet: Secondary | ICD-10-CM | POA: Diagnosis not present

## 2019-07-14 ENCOUNTER — Ambulatory Visit: Payer: Medicare HMO | Admitting: Family Medicine

## 2019-07-15 DIAGNOSIS — K5901 Slow transit constipation: Secondary | ICD-10-CM | POA: Diagnosis not present

## 2019-07-15 DIAGNOSIS — N3946 Mixed incontinence: Secondary | ICD-10-CM | POA: Diagnosis not present

## 2019-07-15 DIAGNOSIS — M6281 Muscle weakness (generalized): Secondary | ICD-10-CM | POA: Diagnosis not present

## 2019-07-15 DIAGNOSIS — M62838 Other muscle spasm: Secondary | ICD-10-CM | POA: Diagnosis not present

## 2019-07-15 DIAGNOSIS — M6289 Other specified disorders of muscle: Secondary | ICD-10-CM | POA: Diagnosis not present

## 2019-07-16 DIAGNOSIS — M7062 Trochanteric bursitis, left hip: Secondary | ICD-10-CM | POA: Diagnosis not present

## 2019-07-16 DIAGNOSIS — M7061 Trochanteric bursitis, right hip: Secondary | ICD-10-CM | POA: Diagnosis not present

## 2019-07-16 DIAGNOSIS — R2681 Unsteadiness on feet: Secondary | ICD-10-CM | POA: Diagnosis not present

## 2019-07-20 DIAGNOSIS — R2681 Unsteadiness on feet: Secondary | ICD-10-CM | POA: Diagnosis not present

## 2019-07-20 DIAGNOSIS — M7062 Trochanteric bursitis, left hip: Secondary | ICD-10-CM | POA: Diagnosis not present

## 2019-07-20 DIAGNOSIS — M7061 Trochanteric bursitis, right hip: Secondary | ICD-10-CM | POA: Diagnosis not present

## 2019-07-22 DIAGNOSIS — N3946 Mixed incontinence: Secondary | ICD-10-CM | POA: Diagnosis not present

## 2019-07-22 DIAGNOSIS — M7060 Trochanteric bursitis, unspecified hip: Secondary | ICD-10-CM | POA: Diagnosis not present

## 2019-07-22 DIAGNOSIS — M6289 Other specified disorders of muscle: Secondary | ICD-10-CM | POA: Diagnosis not present

## 2019-07-22 DIAGNOSIS — M62838 Other muscle spasm: Secondary | ICD-10-CM | POA: Diagnosis not present

## 2019-07-22 DIAGNOSIS — M6281 Muscle weakness (generalized): Secondary | ICD-10-CM | POA: Diagnosis not present

## 2019-07-22 DIAGNOSIS — K5901 Slow transit constipation: Secondary | ICD-10-CM | POA: Diagnosis not present

## 2019-07-23 DIAGNOSIS — M7062 Trochanteric bursitis, left hip: Secondary | ICD-10-CM | POA: Diagnosis not present

## 2019-07-23 DIAGNOSIS — R2681 Unsteadiness on feet: Secondary | ICD-10-CM | POA: Diagnosis not present

## 2019-07-23 DIAGNOSIS — M7061 Trochanteric bursitis, right hip: Secondary | ICD-10-CM | POA: Diagnosis not present

## 2019-07-26 DIAGNOSIS — E781 Pure hyperglyceridemia: Secondary | ICD-10-CM | POA: Insufficient documentation

## 2019-07-26 DIAGNOSIS — E559 Vitamin D deficiency, unspecified: Secondary | ICD-10-CM | POA: Insufficient documentation

## 2019-07-27 DIAGNOSIS — M6281 Muscle weakness (generalized): Secondary | ICD-10-CM | POA: Diagnosis not present

## 2019-07-27 DIAGNOSIS — M6289 Other specified disorders of muscle: Secondary | ICD-10-CM | POA: Diagnosis not present

## 2019-07-27 DIAGNOSIS — M62838 Other muscle spasm: Secondary | ICD-10-CM | POA: Diagnosis not present

## 2019-07-27 DIAGNOSIS — N3946 Mixed incontinence: Secondary | ICD-10-CM | POA: Diagnosis not present

## 2019-07-27 DIAGNOSIS — K5901 Slow transit constipation: Secondary | ICD-10-CM | POA: Diagnosis not present

## 2019-07-28 DIAGNOSIS — M7062 Trochanteric bursitis, left hip: Secondary | ICD-10-CM | POA: Diagnosis not present

## 2019-07-28 DIAGNOSIS — M7061 Trochanteric bursitis, right hip: Secondary | ICD-10-CM | POA: Diagnosis not present

## 2019-07-28 DIAGNOSIS — R2681 Unsteadiness on feet: Secondary | ICD-10-CM | POA: Diagnosis not present

## 2019-07-30 DIAGNOSIS — R2681 Unsteadiness on feet: Secondary | ICD-10-CM | POA: Diagnosis not present

## 2019-07-30 DIAGNOSIS — M7062 Trochanteric bursitis, left hip: Secondary | ICD-10-CM | POA: Diagnosis not present

## 2019-07-30 DIAGNOSIS — M7061 Trochanteric bursitis, right hip: Secondary | ICD-10-CM | POA: Diagnosis not present

## 2019-08-03 DIAGNOSIS — Z85828 Personal history of other malignant neoplasm of skin: Secondary | ICD-10-CM | POA: Diagnosis not present

## 2019-08-03 DIAGNOSIS — D3617 Benign neoplasm of peripheral nerves and autonomic nervous system of trunk, unspecified: Secondary | ICD-10-CM | POA: Diagnosis not present

## 2019-08-03 DIAGNOSIS — C44519 Basal cell carcinoma of skin of other part of trunk: Secondary | ICD-10-CM | POA: Diagnosis not present

## 2019-08-03 DIAGNOSIS — L570 Actinic keratosis: Secondary | ICD-10-CM | POA: Diagnosis not present

## 2019-08-03 DIAGNOSIS — L821 Other seborrheic keratosis: Secondary | ICD-10-CM | POA: Diagnosis not present

## 2019-08-03 DIAGNOSIS — D485 Neoplasm of uncertain behavior of skin: Secondary | ICD-10-CM | POA: Diagnosis not present

## 2019-08-05 DIAGNOSIS — K5900 Constipation, unspecified: Secondary | ICD-10-CM | POA: Diagnosis not present

## 2019-08-05 DIAGNOSIS — R151 Fecal smearing: Secondary | ICD-10-CM | POA: Diagnosis not present

## 2019-08-05 DIAGNOSIS — M6281 Muscle weakness (generalized): Secondary | ICD-10-CM | POA: Diagnosis not present

## 2019-08-05 DIAGNOSIS — M62838 Other muscle spasm: Secondary | ICD-10-CM | POA: Diagnosis not present

## 2019-08-05 DIAGNOSIS — N3946 Mixed incontinence: Secondary | ICD-10-CM | POA: Diagnosis not present

## 2019-08-05 DIAGNOSIS — M6289 Other specified disorders of muscle: Secondary | ICD-10-CM | POA: Diagnosis not present

## 2019-08-13 DIAGNOSIS — M545 Low back pain: Secondary | ICD-10-CM | POA: Diagnosis not present

## 2019-08-13 DIAGNOSIS — M9904 Segmental and somatic dysfunction of sacral region: Secondary | ICD-10-CM | POA: Diagnosis not present

## 2019-08-13 DIAGNOSIS — M62830 Muscle spasm of back: Secondary | ICD-10-CM | POA: Diagnosis not present

## 2019-08-13 DIAGNOSIS — M9903 Segmental and somatic dysfunction of lumbar region: Secondary | ICD-10-CM | POA: Diagnosis not present

## 2019-08-18 DIAGNOSIS — M545 Low back pain: Secondary | ICD-10-CM | POA: Diagnosis not present

## 2019-08-18 DIAGNOSIS — M9903 Segmental and somatic dysfunction of lumbar region: Secondary | ICD-10-CM | POA: Diagnosis not present

## 2019-08-18 DIAGNOSIS — M9904 Segmental and somatic dysfunction of sacral region: Secondary | ICD-10-CM | POA: Diagnosis not present

## 2019-08-18 DIAGNOSIS — M62830 Muscle spasm of back: Secondary | ICD-10-CM | POA: Diagnosis not present

## 2019-08-20 DIAGNOSIS — M545 Low back pain: Secondary | ICD-10-CM | POA: Diagnosis not present

## 2019-08-20 DIAGNOSIS — M62830 Muscle spasm of back: Secondary | ICD-10-CM | POA: Diagnosis not present

## 2019-08-20 DIAGNOSIS — M9903 Segmental and somatic dysfunction of lumbar region: Secondary | ICD-10-CM | POA: Diagnosis not present

## 2019-08-20 DIAGNOSIS — M9904 Segmental and somatic dysfunction of sacral region: Secondary | ICD-10-CM | POA: Diagnosis not present

## 2019-08-24 DIAGNOSIS — M62830 Muscle spasm of back: Secondary | ICD-10-CM | POA: Diagnosis not present

## 2019-08-24 DIAGNOSIS — M545 Low back pain: Secondary | ICD-10-CM | POA: Diagnosis not present

## 2019-08-24 DIAGNOSIS — M9903 Segmental and somatic dysfunction of lumbar region: Secondary | ICD-10-CM | POA: Diagnosis not present

## 2019-08-24 DIAGNOSIS — M9904 Segmental and somatic dysfunction of sacral region: Secondary | ICD-10-CM | POA: Diagnosis not present

## 2019-08-25 DIAGNOSIS — M9903 Segmental and somatic dysfunction of lumbar region: Secondary | ICD-10-CM | POA: Diagnosis not present

## 2019-08-25 DIAGNOSIS — M9904 Segmental and somatic dysfunction of sacral region: Secondary | ICD-10-CM | POA: Diagnosis not present

## 2019-08-25 DIAGNOSIS — M62830 Muscle spasm of back: Secondary | ICD-10-CM | POA: Diagnosis not present

## 2019-08-25 DIAGNOSIS — M545 Low back pain: Secondary | ICD-10-CM | POA: Diagnosis not present

## 2019-08-26 DIAGNOSIS — M6281 Muscle weakness (generalized): Secondary | ICD-10-CM | POA: Diagnosis not present

## 2019-08-26 DIAGNOSIS — N3946 Mixed incontinence: Secondary | ICD-10-CM | POA: Diagnosis not present

## 2019-08-26 DIAGNOSIS — M6289 Other specified disorders of muscle: Secondary | ICD-10-CM | POA: Diagnosis not present

## 2019-08-26 DIAGNOSIS — K5900 Constipation, unspecified: Secondary | ICD-10-CM | POA: Diagnosis not present

## 2019-08-26 DIAGNOSIS — R151 Fecal smearing: Secondary | ICD-10-CM | POA: Diagnosis not present

## 2019-08-26 DIAGNOSIS — M62838 Other muscle spasm: Secondary | ICD-10-CM | POA: Diagnosis not present

## 2019-08-27 DIAGNOSIS — M9904 Segmental and somatic dysfunction of sacral region: Secondary | ICD-10-CM | POA: Diagnosis not present

## 2019-08-27 DIAGNOSIS — M62830 Muscle spasm of back: Secondary | ICD-10-CM | POA: Diagnosis not present

## 2019-08-27 DIAGNOSIS — M9903 Segmental and somatic dysfunction of lumbar region: Secondary | ICD-10-CM | POA: Diagnosis not present

## 2019-08-27 DIAGNOSIS — M545 Low back pain: Secondary | ICD-10-CM | POA: Diagnosis not present

## 2019-08-31 DIAGNOSIS — M62830 Muscle spasm of back: Secondary | ICD-10-CM | POA: Diagnosis not present

## 2019-08-31 DIAGNOSIS — M9903 Segmental and somatic dysfunction of lumbar region: Secondary | ICD-10-CM | POA: Diagnosis not present

## 2019-08-31 DIAGNOSIS — M9904 Segmental and somatic dysfunction of sacral region: Secondary | ICD-10-CM | POA: Diagnosis not present

## 2019-08-31 DIAGNOSIS — M545 Low back pain: Secondary | ICD-10-CM | POA: Diagnosis not present

## 2019-09-01 DIAGNOSIS — M9903 Segmental and somatic dysfunction of lumbar region: Secondary | ICD-10-CM | POA: Diagnosis not present

## 2019-09-01 DIAGNOSIS — M545 Low back pain: Secondary | ICD-10-CM | POA: Diagnosis not present

## 2019-09-01 DIAGNOSIS — M9904 Segmental and somatic dysfunction of sacral region: Secondary | ICD-10-CM | POA: Diagnosis not present

## 2019-09-01 DIAGNOSIS — M62830 Muscle spasm of back: Secondary | ICD-10-CM | POA: Diagnosis not present

## 2019-09-03 ENCOUNTER — Other Ambulatory Visit: Payer: Self-pay

## 2019-09-03 DIAGNOSIS — R6889 Other general symptoms and signs: Secondary | ICD-10-CM | POA: Diagnosis not present

## 2019-09-03 DIAGNOSIS — Z20822 Contact with and (suspected) exposure to covid-19: Secondary | ICD-10-CM

## 2019-09-04 LAB — NOVEL CORONAVIRUS, NAA: SARS-CoV-2, NAA: DETECTED — AB

## 2019-09-07 ENCOUNTER — Other Ambulatory Visit: Payer: Self-pay | Admitting: Family Medicine

## 2019-09-07 ENCOUNTER — Telehealth: Payer: Self-pay | Admitting: Family Medicine

## 2019-09-07 DIAGNOSIS — J40 Bronchitis, not specified as acute or chronic: Secondary | ICD-10-CM

## 2019-09-07 DIAGNOSIS — R05 Cough: Secondary | ICD-10-CM

## 2019-09-07 DIAGNOSIS — R059 Cough, unspecified: Secondary | ICD-10-CM

## 2019-09-07 MED ORDER — HYDROCOD POLST-CPM POLST ER 10-8 MG/5ML PO SUER: 5.0000 mL | Freq: Two times a day (BID) | ORAL | 0 refills | Status: DC | PRN

## 2019-09-07 NOTE — Telephone Encounter (Signed)
Patient called states her & husband's COVID test were POSITIVE & now she has developed a deep chest cough & requesting Rx .  --Forwarding message to medical assistant for review w/provider & to contact pt with Instruction or call in Rx to :  [facility] [place] [phone] (Phone) [phone] (Fax)   -glh

## 2019-09-07 NOTE — Telephone Encounter (Signed)
Pt states that she is coughing but denies SOB.  COVID test was positive on 09/03/2019, documented in chart.  Pt is requesting an RX Tussionex.  Charyl Bigger, CMA

## 2019-09-08 NOTE — Telephone Encounter (Signed)
Pt informed.  Pt expressed understanding and is agreeable.  T. Roseline Ebarb, CMA  

## 2019-09-08 NOTE — Telephone Encounter (Signed)
This was sent/faxed into pharmacy yesterday for pt.  IF pt still has not picked it up, please tell her to do so.  thnx

## 2019-09-10 ENCOUNTER — Telehealth: Payer: Medicare HMO | Admitting: Emergency Medicine

## 2019-09-10 ENCOUNTER — Other Ambulatory Visit: Payer: Self-pay | Admitting: Family Medicine

## 2019-09-10 ENCOUNTER — Emergency Department (HOSPITAL_COMMUNITY)
Admission: EM | Admit: 2019-09-10 | Discharge: 2019-09-10 | Disposition: A | Discharge status: Home / Self Care | Payer: Medicare HMO | Attending: Emergency Medicine | Admitting: Emergency Medicine

## 2019-09-10 ENCOUNTER — Other Ambulatory Visit: Payer: Self-pay

## 2019-09-10 ENCOUNTER — Telehealth: Payer: Self-pay | Admitting: Family Medicine

## 2019-09-10 ENCOUNTER — Encounter (HOSPITAL_COMMUNITY): Payer: Self-pay | Admitting: Emergency Medicine

## 2019-09-10 ENCOUNTER — Emergency Department (HOSPITAL_COMMUNITY): Payer: Medicare HMO

## 2019-09-10 DIAGNOSIS — U071 COVID-19: Secondary | ICD-10-CM | POA: Diagnosis not present

## 2019-09-10 DIAGNOSIS — Z79899 Other long term (current) drug therapy: Secondary | ICD-10-CM | POA: Diagnosis not present

## 2019-09-10 DIAGNOSIS — R05 Cough: Secondary | ICD-10-CM | POA: Diagnosis not present

## 2019-09-10 DIAGNOSIS — R110 Nausea: Secondary | ICD-10-CM | POA: Insufficient documentation

## 2019-09-10 DIAGNOSIS — J029 Acute pharyngitis, unspecified: Secondary | ICD-10-CM | POA: Diagnosis not present

## 2019-09-10 DIAGNOSIS — R042 Hemoptysis: Secondary | ICD-10-CM

## 2019-09-10 DIAGNOSIS — R059 Cough, unspecified: Secondary | ICD-10-CM

## 2019-09-10 DIAGNOSIS — R062 Wheezing: Secondary | ICD-10-CM

## 2019-09-10 DIAGNOSIS — J40 Bronchitis, not specified as acute or chronic: Secondary | ICD-10-CM

## 2019-09-10 LAB — CBC WITH DIFFERENTIAL/PLATELET
Abs Immature Granulocytes: 0.01 10*3/uL (ref 0.00–0.07)
Basophils Absolute: 0 10*3/uL (ref 0.0–0.1)
Basophils Relative: 0 %
Eosinophils Absolute: 0.1 10*3/uL (ref 0.0–0.5)
Eosinophils Relative: 1 %
HCT: 43 % (ref 36.0–46.0)
Hemoglobin: 13.7 g/dL (ref 12.0–15.0)
Immature Granulocytes: 0 %
Lymphocytes Relative: 29 %
Lymphs Abs: 1.2 10*3/uL (ref 0.7–4.0)
MCH: 28.7 pg (ref 26.0–34.0)
MCHC: 31.9 g/dL (ref 30.0–36.0)
MCV: 90 fL (ref 80.0–100.0)
Monocytes Absolute: 0.4 10*3/uL (ref 0.1–1.0)
Monocytes Relative: 9 %
Neutro Abs: 2.4 10*3/uL (ref 1.7–7.7)
Neutrophils Relative %: 61 %
Platelets: 152 10*3/uL (ref 150–400)
RBC: 4.78 MIL/uL (ref 3.87–5.11)
RDW: 13.9 % (ref 11.5–15.5)
WBC: 4.1 10*3/uL (ref 4.0–10.5)
nRBC: 0 % (ref 0.0–0.2)

## 2019-09-10 LAB — COMPREHENSIVE METABOLIC PANEL
ALT: 22 U/L (ref 0–44)
AST: 22 U/L (ref 15–41)
Albumin: 3.7 g/dL (ref 3.5–5.0)
Alkaline Phosphatase: 67 U/L (ref 38–126)
Anion gap: 9 (ref 5–15)
BUN: 11 mg/dL (ref 8–23)
CO2: 25 mmol/L (ref 22–32)
Calcium: 8.9 mg/dL (ref 8.9–10.3)
Chloride: 105 mmol/L (ref 98–111)
Creatinine, Ser: 0.79 mg/dL (ref 0.44–1.00)
GFR calc Af Amer: >60 mL/min (ref >60)
GFR calc non Af Amer: >60 mL/min (ref >60)
Glucose, Bld: 103 mg/dL — ABNORMAL HIGH (ref 70–99)
Potassium: 4.2 mmol/L (ref 3.5–5.1)
Sodium: 139 mmol/L (ref 135–145)
Total Bilirubin: 0.5 mg/dL (ref 0.3–1.2)
Total Protein: 6.7 g/dL (ref 6.5–8.1)

## 2019-09-10 MED ORDER — HYDROCOD POLST-CPM POLST ER 10-8 MG/5ML PO SUER: 5.0000 mL | Freq: Two times a day (BID) | ORAL | 0 refills | Status: DC | PRN

## 2019-09-10 MED ORDER — ONDANSETRON HCL 4 MG PO TABS: 4.0000 mg | ORAL_TABLET | Freq: Four times a day (QID) | ORAL | 0 refills | Status: AC

## 2019-09-10 NOTE — Telephone Encounter (Signed)
Patient states [facility] never received Tussionex.  Advised patient rx was faxed but regulations say it needs to be done electronically.  Please resend.

## 2019-09-10 NOTE — ED Provider Notes (Signed)
[facility] EMERGENCY DEPARTMENT Provider Note   CSN: [id number] Arrival date & time: 09/10/19  1124     History   Chief Complaint Chief Complaint  Patient presents with  . COVID19 +    HPI Deborah Fowler is a 68 y.o. female.     68 y.o female with a PMH of Prediabetes, presents to the ED with a chief complaint of cough, shortness of breath.  Patient was diagnosed with COVID-19 about a week ago.  She reports her symptoms have been tolerable at home.  States she had some concern as she began having this dry cough, ports yesterday she had a speckle of blood in the tissue after coughing.  This has not reoccurred after yesterday.  He also endorses a sore throat, along with some mild nausea without any vomiting.  She reports taking her husband's hydrocodone syrup to help with her symptoms with relief and cough.  She states she has a prescription currently at [facility] for her symptoms however has not picked up the prescription yet.  She was seen via telehealth and instructed to be seen in the ED to further evaluate her symptoms.  Reports her shortness of breath occurs when she is coughing, there is no chest pain associated with it.  She denies any fever, shortness of breath, chest pain, abdominal pain or prior cardiac history.  Patient also denies any fevers throughout the course of her disease.     The history is provided by the patient and medical records.    Past Medical History:  Diagnosis Date  . Anxiety state   . Breast lump   . Bursitis of hip   . Cervical arthritis   . Cobalamin deficiency   . Mixed hyperlipidemia   . Tinnitus     Patient Active Problem List   Diagnosis Date Noted  . Vitamin D insufficiency 07/26/2019  . Hypertriglyceridemia 07/26/2019  . Prediabetes 05/28/2019  . Bilateral hip bursitis 05/28/2019  . Chronic constipation- yrs and yrs.  05/28/2019  . Depression, recurrent ([place]) 05/28/2019  . GAD (generalized anxiety disorder) with  panic disorder 05/28/2019  . 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 HLD (hyperlipidemia) 05/28/2019  . Elevated blood pressure reading 05/28/2019  . Primary open angle glaucoma of left eye, severe stage 04/17/2018    Past Surgical History:  Procedure Laterality Date  . CARPAL TUNNEL RELEASE    . PLANTAR FASCIA SURGERY       OB History   No obstetric history on file.      Home Medications    Prior to Admission medications   Medication Sig Start Date End Date Taking? Authorizing Provider  ALPRAZolam Duanne Moron) 0.5 MG tablet Take 0.5 mg by mouth as needed for anxiety.    [provider]  Calcium Carbonate (CALCIUM 600 PO) Take 1,200 mg by mouth.    [provider]  celecoxib (CELEBREX) 200 MG capsule Take 200 mg by mouth daily.    [provider]  cetirizine (ZYRTEC) 10 MG tablet Take 10 mg by mouth daily.    [provider]  chlorpheniramine-HYDROcodone (TUSSIONEX) 10-8 MG/5ML SUER Take 5 mLs by mouth every 12 (twelve) hours as needed for cough (cough, will cause drowsiness.). 09/07/19   Opalski, Neoma Laming, DO  latanoprost (XALATAN) 0.005 % ophthalmic solution  03/10/17   [provider]  Omega-3 Fatty Acids (FISH OIL PO) Take 1,000 mg by mouth daily.    [provider]  ondansetron (ZOFRAN) 4 MG  tablet Take 1 tablet (4 mg total) by mouth every 6 (six) hours for 7 days. 09/10/19 09/17/19  Janeece Fitting, PA-C  polyethylene glycol (MIRALAX / GLYCOLAX) packet Take 17 g by mouth daily as needed.    [provider]  RESTASIS 0.05 % ophthalmic emulsion INSTILL 1 DROP INTO EACH EYE TWICE DAILY 05/13/19   [provider]  sertraline (ZOLOFT) 100 MG tablet Take 1 tablet (100 mg total) by mouth daily. 07/07/19   Mellody Dance, DO  timolol (TIMOPTIC) 0.5 % ophthalmic solution  03/10/17   [provider]  TRIAMCINOLONE ACETONIDE, TOP, 0.05 % OINT Apply topically.    [provider]   vitamin B-12 (CYANOCOBALAMIN) 1000 MCG tablet Take 1,000 mcg by mouth daily.    [provider]    Family History Family History  Problem Relation Age of Onset  . Hyperlipidemia Mother   . Alcohol abuse Father   . Heart attack Brother   . Colon cancer Maternal Grandmother     Social History Social History   Tobacco Use  . Smoking status: Unknown If Ever Smoked  . Smokeless tobacco: Never Used  Substance Use Topics  . Alcohol use: Yes    Alcohol/week: 5.0 standard drinks    Types: 5 Standard drinks or equivalent per week  . Drug use: Never     Allergies   Patient has no known allergies.   Review of Systems Review of Systems  Constitutional: Negative for chills and fever.  HENT: Negative for ear pain and sore throat.   Eyes: Negative for pain and visual disturbance.  Respiratory: Positive for cough and shortness of breath. Negative for wheezing.   Cardiovascular: Negative for chest pain and palpitations.  Gastrointestinal: Negative for abdominal pain and vomiting.  Genitourinary: Negative for dysuria and hematuria.  Musculoskeletal: Negative for arthralgias and back pain.  Skin: Negative for color change and rash.  Neurological: Negative for seizures and syncope.  All other systems reviewed and are negative.    Physical Exam Updated Vital Signs BP (!) 121/59   Pulse 73   Temp 98.3 F (36.8 C) (Oral)   Resp 19   Ht 5\' 4"  (1.626 m)   Wt 88.5 kg   SpO2 95%   BMI 33.47 kg/m   Physical Exam Vitals signs and nursing note reviewed.  Constitutional:      General: She is not in acute distress.    Appearance: She is well-developed.     Comments: Non-ill-appearing.  HENT:     Head: Normocephalic and atraumatic.     Mouth/Throat:     Pharynx: No oropharyngeal exudate.  Eyes:     Pupils: Pupils are equal, round, and reactive to light.  Neck:     Musculoskeletal: Normal range of motion.  Cardiovascular:     Rate and Rhythm: Normal rate and regular  rhythm.     Heart sounds: Normal heart sounds.     Comments: No bilateral pitting edema. Pulmonary:     Effort: Pulmonary effort is normal. No respiratory distress.     Breath sounds: Normal breath sounds.     Comments: Lungs are clear to auscultation without any wheezing, rales, rhonchi. Chest:     Comments: No tenderness to palpation of the chest. Abdominal:     General: Bowel sounds are normal. There is no distension.     Palpations: Abdomen is soft.     Tenderness: There is no abdominal tenderness.  Musculoskeletal:        General: No tenderness  or deformity.     Right lower leg: No edema.     Left lower leg: No edema.  Skin:    General: Skin is warm and dry.  Neurological:     Mental Status: She is alert and oriented to person, place, and time.      ED Treatments / Results  Labs (all labs ordered are listed, but only abnormal results are displayed) Labs Reviewed  COMPREHENSIVE METABOLIC PANEL - Abnormal; Notable for the following components:      Result Value   Glucose, Bld 103 (*)    All other components within normal limits  CBC WITH DIFFERENTIAL/PLATELET    EKG None  Radiology Dg Chest Portable 1 View  Result Date: 09/10/2019 CLINICAL DATA:  Dry cough for 1 week. EXAM: PORTABLE CHEST 1 VIEW COMPARISON:  05/28/2016 FINDINGS: The heart size and mediastinal contours are within normal limits. Both lungs are clear. The visualized skeletal structures are unremarkable. IMPRESSION: No active disease. Electronically Signed   By: Zetta Bills M.D.   On: 09/10/2019 12:24    Procedures Procedures (including critical care time)  Medications Ordered in ED Medications - No data to display   Initial Impression / Assessment and Plan / ED Course  I have reviewed the triage vital signs and the nursing notes.  Pertinent labs & imaging results that were available during my care of the patient were reviewed by me and considered in my medical decision making (see chart for  details).       Patient with a PMH of prediabetes, hyperlipidemia presents to the ED with complaints of cough.  Patient was diagnosed with Covid 19 about a week ago, she reports symptoms have been managed at home however she is experience worsening cough.  She reports after coughing yesterday she experience a small speckle of blood in her cough which she show concerned.  She was seen via tele-visit today, instructed to be seen in the ED to further evaluate her symptoms. Arrived in the ED with stable vital signs, afebrile, O2 stats are 99% on room air without any tachycardia or tachypnea.  Patient is overall well-appearing, there are no coughing episodes during my interview with patient.  CMP showed no electrolyte derangement, creatinine level is within normal limits.  LFTs are unremarkable.  CBC without any leukocytosis.  An x-ray was obtained which showed no infiltrate, consolidation, pleural effusions.  She does not have a prior history of pulmonary embolisms, there is no hypoxia, tachycardia on my exam.  She is currently not on any exogenous estrogen, low suspicion for any pulmonary embolism at this time.  She reports taking her husband's hydrocodone syrup which has helped with her symptoms.  She does have a current prescription by her PCP which she has yet to pick up from [facility].  Patient was ambulated in the room, oxygenation stayed above 90%.  She does endorse some nausea.  She will go home on a prescription of Zofran to help with her symptoms.  No abdominal pain, episodes of vomiting while in the ED.  Patient with stable vital signs stable for discharge.  I have discussed this case with Dr. Stark Jock who agrees with plan and management.    Portions of this note were generated with Lobbyist. Dictation errors may occur despite best attempts at proofreading.  Final Clinical Impressions(s) / ED Diagnoses   Final diagnoses:  COVID-19 virus infection    ED Discharge Orders          Ordered  ondansetron (ZOFRAN) 4 MG tablet  Every 6 hours     09/10/19 1408           Janeece Fitting, PA-C 09/10/19 1408    Veryl Speak, MD 09/10/19 1505

## 2019-09-10 NOTE — Progress Notes (Signed)
Based on what you shared with me, I feel your condition warrants further evaluation and I recommend that you be seen for a face to face office visit.  I believe you need a chest xray and clinical evaluation by a provider. Please call the facility to let them know you are coming and COVID positive.  NOTE: If you entered your credit card information for this eVisit, you will not be charged. You may see a "hold" on your card for the $35 but that hold will drop off and you will not have a charge processed.  If you are having a true medical emergency please call 911.     For an urgent face to face visit, [facility] has four urgent care centers for your convenience:   . [facility]    [phone]                  Get Driving Directions  [id number] [place], [place] [address] . 10 am to 8 pm Monday-Friday . 12 pm to 8 pm Saturday-Sunday   . [facility] at [place]                  Get Driving Directions  [phone] [place], [place], [place] [address] . 8 am to 8 pm Monday-Friday . 9 am to 6 pm Saturday . 11 am to 6 pm Sunday   . [facility] at [place]                  Get Driving Directions   [address] [place], [place] [address] . 8 am to 8 pm Monday-Friday . 8 am to 4 pm Saturday-Sunday    . [facility] at [place]                    Get Driving Directions  [id number]  [address] [place], [place] [address]  . Monday-Friday, 12 PM to 6 PM    Your e-visit answers were reviewed by a board certified advanced clinical practitioner to complete your personal care plan.  Thank you for using e-Visits.   Greater than 5 minutes but less than 10 minutes was spent researching, coordinating, and implementing care for this patient.

## 2019-09-10 NOTE — ED Notes (Signed)
Patient verbalizes understanding of discharge instructions. Opportunity for questioning and answers were provided. Armband removed by staff, pt discharged from ED.  

## 2019-09-10 NOTE — Telephone Encounter (Signed)
Duplicate see prior phone note.

## 2019-09-10 NOTE — Telephone Encounter (Signed)
Patient called states  [facility] says no Rx received for :  chlorpheniramine-HYDROcodone (TUSSIONEX) 10-8 MG/5ML SUER [id number]   Order Details Dose: 5 mL Route: Oral Frequency: Every 12 hours PRN for cough, cough, will cause drowsiness.  Indications of Use: Cough and Nasal Congestion  Dispense Quantity: 200 mL Refills: 0 Fills remaining: --        Sig: Take 5 mLs by mouth every 12 (twelve) hours as needed for cough (cough, will cause drowsiness.).          ---Forwarding msg to medical assistant for clarification of if Rx sent or printed :  Outpatient Medication Detail   Disp Refills Start End   chlorpheniramine-HYDROcodone (TUSSIONEX) 10-8 MG/5ML SUER 200 mL 0 09/07/2019    Sig - Route: Take 5 mLs by mouth every 12 (twelve) hours as needed for cough (cough, will cause drowsiness.). - Oral   Class: Print   Earliest Fill Date: 09/07/2019    --Please contact pt directly @ [phone].  -glh

## 2019-09-10 NOTE — Telephone Encounter (Signed)
Patient called back states she called [facility] in [place] again was told this time  ( Controlled Substance Rxs) have to be sent Electronically.  --Forwarding updated message to medical assistant.  --Dion Body

## 2019-09-10 NOTE — Discharge Instructions (Addendum)
Your chest x-ray today was negative.  Your laboratory results were discussed with you at length.  You may take Tylenol to help with your symptoms, you may also pick up your prescription which is currently at the pharmacy to help with your cough.  If you experience any worsening symptoms such as chest pain, shortness of breath you may return to the emergency department.

## 2019-09-10 NOTE — ED Triage Notes (Addendum)
Patient arrived from home reporting that she tested positive for COVID-19 a week ago; reports "ocassional cough" with intermittent sputum. She reports coughing up blood "about the size of my little fingernail" yesterday. She says she took prescription cough medicine-hydrocodone which was very helpful with cough and body aches. Last hydrocodone was 0830 this morning Denies fever, reports body aches.

## 2019-09-11 ENCOUNTER — Telehealth: Payer: Medicare HMO | Admitting: Nurse Practitioner

## 2019-09-11 ENCOUNTER — Telehealth: Payer: Self-pay | Admitting: Family Medicine

## 2019-09-11 DIAGNOSIS — Z7189 Other specified counseling: Secondary | ICD-10-CM | POA: Diagnosis not present

## 2019-09-11 MED ORDER — BENZONATATE 100 MG PO CAPS: 100.0000 mg | ORAL_CAPSULE | Freq: Three times a day (TID) | ORAL | 0 refills | Status: DC | PRN

## 2019-09-11 NOTE — Telephone Encounter (Signed)
Patient called states pharmacy is out of :  Outpatient Medication Detail   Disp Refills Start End   chlorpheniramine-HYDROcodone (TUSSIONEX) 10-8 MG/5ML SUER 200 mL 0 09/10/2019    Sig - Route: Take 5 mLs by mouth every 12 (twelve) hours as needed for cough (cough, will cause drowsiness.). - Oral   Sent to pharmacy as: chlorpheniramine-HYDROcodone ([place]) 10-8 MG/5ML Suspension Extended Release   Earliest Fill Date: 09/10/2019   E-Prescribing Status: Receipt confirmed by pharmacy (09/10/2019 5:24 PM EDT)    ---Pharmacy rep says can change to different Rx w/ provider approval. Hicodan or veratuson ? (sp)  --forwarding to medica asst.  glh

## 2019-09-11 NOTE — Telephone Encounter (Signed)
Tussionex escribed.

## 2019-09-11 NOTE — Telephone Encounter (Signed)
Advised patient pharmacy needs to order the mediaction or see if another store has it.  New rx cannot be sent at this time.  She can also try over the counter medication.  Spoke to [facility] and they are going to try to order the medication if not see if other stores has it.  If they cannot get the medication they will contact the office via fax.

## 2019-09-11 NOTE — Progress Notes (Signed)
Your test for COVID-19 was positive, meaning that you were infected with the novel coronavirus and could give the germ to others.    You have been enrolled in [place] for COVID-19. Daily you will receive a questionnaire within the [facility] website. Our COVID-19 response team will be monitoring your responses daily.  Please continue isolation at home, for at least 7 days since the start of your fever/cough/breathlessness and until you have had 3 consecutive days without fever (without taking a fever reducer) and with cough/breathlessness improving. Please continue good preventive care measures, including:  frequent hand-washing, avoid touching your face, cover coughs/sneezes, stay out of crowds and keep a 6 foot distance from others.  Recheck or go to the nearest hospital ED tent for re-assessment if fever/cough/breathlessness return.   I cannot do hydcodan cough meds in an e visit, because it is controlled. But I will send in tessalon perles rx.   COVID-19 is a respiratory illness with symptoms that are similar to the flu. Symptoms are typically mild to moderate, but there have been cases of severe illness and death due to the virus. The following symptoms may appear 2-14 days after exposure: . Fever . Cough . Shortness of breath or difficulty breathing . Chills . Repeated shaking with chills . Muscle pain . Headache . Sore throat . New loss of taste or smell . Fatigue . Congestion or runny nose . Nausea or vomiting . Diarrhea  It is vitally important that if you feel that you have an infection such as this virus or any other virus that you stay home and away from places where you may spread it to others.  You should self-quarantine for 14 days if you have symptoms that could potentially be coronavirus or have been in close contact a with a person diagnosed with COVID-19 within the last 2 weeks. You should avoid contact with people age 59 and older.   You should wear a mask  or cloth face covering over your nose and mouth if you must be around other people or animals, including pets (even at home). Try to stay at least 6 feet away from other people. This will protect the people around you.  You can use medication such as A prescription cough medication called Tessalon Perles 100 mg. You may take 1-2 capsules every 8 hours as needed for cough  You may also take acetaminophen (Tylenol) as needed for fever.   Reduce your risk of any infection by using the same precautions used for avoiding the common cold or flu:  Marland Kitchen Wash your hands often with soap and warm water for at least 20 seconds.  If soap and water are not readily available, use an alcohol-based hand sanitizer with at least 60% alcohol.  . If coughing or sneezing, cover your mouth and nose by coughing or sneezing into the elbow areas of your shirt or coat, into a tissue or into your sleeve (not your hands). . Avoid shaking hands with others and consider head nods or verbal greetings only. . Avoid touching your eyes, nose, or mouth with unwashed hands.  . Avoid close contact with people who are sick. . Avoid places or events with large numbers of people in one location, like concerts or sporting events. . Carefully consider travel plans you have or are making. . If you are planning any travel outside or inside the [place], visit the CDC's Travelers' Health webpage for the latest health notices. . If you have some symptoms but  not all symptoms, continue to monitor at home and seek medical attention if your symptoms worsen. . If you are having a medical emergency, call 911.  HOME CARE . Only take medications as instructed by your medical team. . Drink plenty of fluids and get plenty of rest. . A steam or ultrasonic humidifier can help if you have congestion.   GET HELP RIGHT AWAY IF YOU HAVE EMERGENCY WARNING SIGNS** FOR COVID-19. If you or someone is showing any of these signs seek emergency medical care  immediately. Call 911 or proceed to your closest emergency facility if: . You develop worsening high fever. . Trouble breathing . Bluish lips or face . Persistent pain or pressure in the chest . New confusion . Inability to wake or stay awake . You cough up blood. . Your symptoms become more severe  **This list is not all possible symptoms. Contact your medical provider for any symptoms that are sever or concerning to you.   MAKE SURE YOU   Understand these instructions.  Will watch your condition.  Will get help right away if you are not doing well or get worse.  Your e-visit answers were reviewed by a board certified advanced clinical practitioner to complete your personal care plan.  Depending on the condition, your plan could have included both over the counter or prescription medications.  If there is a problem please reply once you have received a response from your provider.  Your safety is important to [place].  If you have drug allergies check your prescription carefully.    You can use MyChart to ask questions about today's visit, request a non-urgent call back, or ask for a work or school excuse for 24 hours related to this e-Visit. If it has been greater than 24 hours you will need to follow up with your provider, or enter a new e-Visit to address those concerns. You will get an e-mail in the next two days asking about your experience.  I hope that your e-visit has been valuable and will speed your recovery. Thank you for using e-visits.   5-10 minutes spent reviewing and documenting in chart.

## 2019-09-14 ENCOUNTER — Telehealth: Payer: Self-pay

## 2019-09-14 ENCOUNTER — Telehealth: Payer: Self-pay | Admitting: Family Medicine

## 2019-09-14 DIAGNOSIS — J40 Bronchitis, not specified as acute or chronic: Secondary | ICD-10-CM

## 2019-09-14 DIAGNOSIS — R05 Cough: Secondary | ICD-10-CM

## 2019-09-14 DIAGNOSIS — U071 COVID-19: Secondary | ICD-10-CM

## 2019-09-14 DIAGNOSIS — R059 Cough, unspecified: Secondary | ICD-10-CM

## 2019-09-14 MED ORDER — HYDROCOD POLST-CPM POLST ER 10-8 MG/5ML PO SUER: 5.0000 mL | Freq: Two times a day (BID) | ORAL | 0 refills | Status: DC | PRN

## 2019-09-14 NOTE — Telephone Encounter (Signed)
[facility] cannot fill Tussionex and cannot order.  Please change rx.

## 2019-09-14 NOTE — Telephone Encounter (Signed)
Duplicate encounter sent for tussionex rx to be sent to [facility].

## 2019-09-14 NOTE — Telephone Encounter (Signed)
Because paper confirmation was received via fax from [facility] confirming that they do not have Tussionex in stock and are unable to fill that prescription which was previously sent out, I asked staff call [facility] to see if they had Tussionex in stock.   Baker Janus confirmed they do have it in stock and new prescription was sent to [facility] drug for patient to pick up.  Baker Janus please call patient and let her know that this medicine can be picked up at [facility] drug pharmacy.   Please let her know that I will be unable to fill any other controlled prescription cough medicines for her if this for some reason does not work out.   Please document your conversation with her and send back to me so I can sign off on this telephone note.  Thanks so much  PS-let patient know if she thinks she needs anything additional in the future, we will need to do a telehealth visit to discuss her concerns.

## 2019-09-14 NOTE — Telephone Encounter (Signed)
Baker Janus, please call over the [facility] to see if they have Tussionex. - please let me know what they say and if they have it.  Please call pt to notify her that we will send the med to them for her.  thnx

## 2019-09-14 NOTE — Telephone Encounter (Signed)
Dr. Raliegh Scarlet requested [facility] checked for availability of Tussionex 10-9 MG / 5ML, I called spoke w/ Colletta [place] who states they do have -- Dr. Raliegh Scarlet advised & patient/ Deborah Fowler contact with Rx availability information @ pharmacy. --glh

## 2019-09-28 DIAGNOSIS — M545 Low back pain: Secondary | ICD-10-CM | POA: Diagnosis not present

## 2019-09-28 DIAGNOSIS — M9903 Segmental and somatic dysfunction of lumbar region: Secondary | ICD-10-CM | POA: Diagnosis not present

## 2019-09-28 DIAGNOSIS — M9904 Segmental and somatic dysfunction of sacral region: Secondary | ICD-10-CM | POA: Diagnosis not present

## 2019-09-28 DIAGNOSIS — M62830 Muscle spasm of back: Secondary | ICD-10-CM | POA: Diagnosis not present

## 2019-10-01 DIAGNOSIS — M545 Low back pain: Secondary | ICD-10-CM | POA: Diagnosis not present

## 2019-10-01 DIAGNOSIS — H04123 Dry eye syndrome of bilateral lacrimal glands: Secondary | ICD-10-CM | POA: Diagnosis not present

## 2019-10-01 DIAGNOSIS — H401131 Primary open-angle glaucoma, bilateral, mild stage: Secondary | ICD-10-CM | POA: Diagnosis not present

## 2019-10-01 DIAGNOSIS — M9903 Segmental and somatic dysfunction of lumbar region: Secondary | ICD-10-CM | POA: Diagnosis not present

## 2019-10-01 DIAGNOSIS — H52203 Unspecified astigmatism, bilateral: Secondary | ICD-10-CM | POA: Diagnosis not present

## 2019-10-01 DIAGNOSIS — H2513 Age-related nuclear cataract, bilateral: Secondary | ICD-10-CM | POA: Diagnosis not present

## 2019-10-01 DIAGNOSIS — H524 Presbyopia: Secondary | ICD-10-CM | POA: Diagnosis not present

## 2019-10-01 DIAGNOSIS — H5213 Myopia, bilateral: Secondary | ICD-10-CM | POA: Diagnosis not present

## 2019-10-01 DIAGNOSIS — M9904 Segmental and somatic dysfunction of sacral region: Secondary | ICD-10-CM | POA: Diagnosis not present

## 2019-10-01 DIAGNOSIS — M62830 Muscle spasm of back: Secondary | ICD-10-CM | POA: Diagnosis not present

## 2019-10-05 DIAGNOSIS — M545 Low back pain: Secondary | ICD-10-CM | POA: Diagnosis not present

## 2019-10-05 DIAGNOSIS — M9903 Segmental and somatic dysfunction of lumbar region: Secondary | ICD-10-CM | POA: Diagnosis not present

## 2019-10-05 DIAGNOSIS — M9904 Segmental and somatic dysfunction of sacral region: Secondary | ICD-10-CM | POA: Diagnosis not present

## 2019-10-05 DIAGNOSIS — M62830 Muscle spasm of back: Secondary | ICD-10-CM | POA: Diagnosis not present

## 2019-10-07 DIAGNOSIS — R151 Fecal smearing: Secondary | ICD-10-CM | POA: Diagnosis not present

## 2019-10-07 DIAGNOSIS — K5900 Constipation, unspecified: Secondary | ICD-10-CM | POA: Diagnosis not present

## 2019-10-07 DIAGNOSIS — N3946 Mixed incontinence: Secondary | ICD-10-CM | POA: Diagnosis not present

## 2019-10-07 DIAGNOSIS — M62838 Other muscle spasm: Secondary | ICD-10-CM | POA: Diagnosis not present

## 2019-10-07 DIAGNOSIS — M6289 Other specified disorders of muscle: Secondary | ICD-10-CM | POA: Diagnosis not present

## 2019-10-07 DIAGNOSIS — M6281 Muscle weakness (generalized): Secondary | ICD-10-CM | POA: Diagnosis not present

## 2019-10-12 DIAGNOSIS — M9903 Segmental and somatic dysfunction of lumbar region: Secondary | ICD-10-CM | POA: Diagnosis not present

## 2019-10-12 DIAGNOSIS — M62830 Muscle spasm of back: Secondary | ICD-10-CM | POA: Diagnosis not present

## 2019-10-12 DIAGNOSIS — M545 Low back pain: Secondary | ICD-10-CM | POA: Diagnosis not present

## 2019-10-12 DIAGNOSIS — M9904 Segmental and somatic dysfunction of sacral region: Secondary | ICD-10-CM | POA: Diagnosis not present

## 2019-10-13 ENCOUNTER — Encounter: Payer: Self-pay | Admitting: Adult Health

## 2019-10-13 ENCOUNTER — Ambulatory Visit: Payer: Medicare HMO | Admitting: Adult Health

## 2019-10-13 ENCOUNTER — Other Ambulatory Visit: Payer: Self-pay

## 2019-10-13 VITALS — Wt 195.0 lb

## 2019-10-14 DIAGNOSIS — M9903 Segmental and somatic dysfunction of lumbar region: Secondary | ICD-10-CM | POA: Diagnosis not present

## 2019-10-14 DIAGNOSIS — L304 Erythema intertrigo: Secondary | ICD-10-CM | POA: Diagnosis not present

## 2019-10-14 DIAGNOSIS — M62830 Muscle spasm of back: Secondary | ICD-10-CM | POA: Diagnosis not present

## 2019-10-14 DIAGNOSIS — L2089 Other atopic dermatitis: Secondary | ICD-10-CM | POA: Diagnosis not present

## 2019-10-14 DIAGNOSIS — M545 Low back pain: Secondary | ICD-10-CM | POA: Diagnosis not present

## 2019-10-14 DIAGNOSIS — M9904 Segmental and somatic dysfunction of sacral region: Secondary | ICD-10-CM | POA: Diagnosis not present

## 2019-10-20 DIAGNOSIS — M9903 Segmental and somatic dysfunction of lumbar region: Secondary | ICD-10-CM | POA: Diagnosis not present

## 2019-10-20 DIAGNOSIS — M62830 Muscle spasm of back: Secondary | ICD-10-CM | POA: Diagnosis not present

## 2019-10-20 DIAGNOSIS — M545 Low back pain: Secondary | ICD-10-CM | POA: Diagnosis not present

## 2019-10-20 DIAGNOSIS — M9904 Segmental and somatic dysfunction of sacral region: Secondary | ICD-10-CM | POA: Diagnosis not present

## 2019-10-21 ENCOUNTER — Telehealth: Payer: Self-pay | Admitting: Family Medicine

## 2019-10-21 MED ORDER — SERTRALINE HCL 100 MG PO TABS: 100.0000 mg | ORAL_TABLET | Freq: Every day | ORAL | 0 refills | Status: AC

## 2019-10-21 NOTE — Telephone Encounter (Signed)
MyChart message sent to pt reminding her that she is due for follow up this month and that no further refills will be granted until she is seen.  Charyl Bigger, CMA

## 2019-10-21 NOTE — Addendum Note (Signed)
Addended by: Fonnie Mu on: 10/21/2019 04:55 PM   Modules accepted: Orders

## 2019-10-21 NOTE — Telephone Encounter (Signed)
Patient is wishing to switch her sertraline from [facility] to [facility]. She is requesting a new prescription to be sent out for her. Please advise if this is something we can do.

## 2019-10-27 ENCOUNTER — Telehealth: Payer: Self-pay | Admitting: Family Medicine

## 2019-10-27 DIAGNOSIS — M9904 Segmental and somatic dysfunction of sacral region: Secondary | ICD-10-CM | POA: Diagnosis not present

## 2019-10-27 DIAGNOSIS — M62830 Muscle spasm of back: Secondary | ICD-10-CM | POA: Diagnosis not present

## 2019-10-27 DIAGNOSIS — M545 Low back pain: Secondary | ICD-10-CM | POA: Diagnosis not present

## 2019-10-27 DIAGNOSIS — M9903 Segmental and somatic dysfunction of lumbar region: Secondary | ICD-10-CM | POA: Diagnosis not present

## 2019-10-27 NOTE — Telephone Encounter (Signed)
Please contact patient and schedule DOXY or Telehealth to discuss COVID symptoms. AS, CMA

## 2019-10-27 NOTE — Telephone Encounter (Signed)
Patient called twice left msg on office voicemail req'd OV w/ provider to discuss continued COVID symptoms--states she is 8 weeks out from [place] wants to talk to [place] (advised provider may prefer a TELEHEALTH ie (DOXY) vs her coming into office for appt but will let PROVIDER make that decision.  ---Forwarding message to med asst to see if this should be a DOXY appt or can pt come into office?  Please let patient know of [phone]  --glh

## 2019-10-29 ENCOUNTER — Ambulatory Visit (INDEPENDENT_AMBULATORY_CARE_PROVIDER_SITE_OTHER): Payer: Medicare HMO | Admitting: Family Medicine

## 2019-10-29 ENCOUNTER — Encounter: Payer: Self-pay | Admitting: Family Medicine

## 2019-10-29 ENCOUNTER — Other Ambulatory Visit: Payer: Self-pay

## 2019-10-29 VITALS — Ht 64.0 in | Wt 204.0 lb

## 2019-10-29 DIAGNOSIS — M9903 Segmental and somatic dysfunction of lumbar region: Secondary | ICD-10-CM | POA: Diagnosis not present

## 2019-10-29 DIAGNOSIS — M545 Low back pain: Secondary | ICD-10-CM | POA: Diagnosis not present

## 2019-10-29 DIAGNOSIS — E785 Hyperlipidemia, unspecified: Secondary | ICD-10-CM

## 2019-10-29 DIAGNOSIS — E559 Vitamin D deficiency, unspecified: Secondary | ICD-10-CM | POA: Diagnosis not present

## 2019-10-29 DIAGNOSIS — F411 Generalized anxiety disorder: Secondary | ICD-10-CM

## 2019-10-29 DIAGNOSIS — R7303 Prediabetes: Secondary | ICD-10-CM

## 2019-10-29 DIAGNOSIS — E781 Pure hyperglyceridemia: Secondary | ICD-10-CM | POA: Diagnosis not present

## 2019-10-29 DIAGNOSIS — M9904 Segmental and somatic dysfunction of sacral region: Secondary | ICD-10-CM | POA: Diagnosis not present

## 2019-10-29 DIAGNOSIS — M62830 Muscle spasm of back: Secondary | ICD-10-CM | POA: Diagnosis not present

## 2019-10-29 NOTE — Progress Notes (Signed)
Telehealth office visit note for Deborah Fowler, D.O- at [facility]   I connected with current patient today and verified that I am speaking with the correct person using two identifiers.   . Location of the patient: Home . Location of the provider: Office Only the patient (+/- their family members at pt's discretion) and myself were participating in the encounter - This visit type was conducted due to national recommendations for restrictions regarding the COVID-19 Pandemic (e.g. social distancing) in an effort to limit this patient's exposure and mitigate transmission in our community.  This format is felt to be most appropriate for this patient at this time.   - The patient did not have access to video technology or had technical difficulties with video requiring transitioning to audio format only. - No physical exam could be performed with this format, beyond that communicated to [place] by the patient/ family members as noted.   - Additionally my office staff/ schedulers discussed with the patient that there may be a monetary charge related to this service, depending on their medical insurance.   The patient expressed understanding, and agreed to proceed.       History of Present Illness:  I, Toni Amend, am serving as scribe for Dr. Mellody Dance.  Notes she's gained some weight and maybe wanted to check her A1c.  - Vitamin D Deficiency Now taking 2000 IU's daily.  - Shortness of Breath with Stress Says she has struggled with shortness of breath her whole life while she's under stress.  Notes sometimes she gets "wound up like a top" and "needs something to calm me down."  Says "I wouldn't call it panic; just when I get up in the morning, I have to try to get a deep breath."  Says she wants something for nerves to "just temper me down some."  Says she takes her Xanax very sparingly; "when I have a real wound up day, I may take half or one, very  little," states "because I'm half afraid of it."  Denies being managed on any other benzodiazepines.  Has not had any prescription of Xanax filled in five years, September 14th 2015.  Notes "he gave me 60 and I probably have ten left."  - Lifestyle Says "I have done nothing but eat, and I've had to stop walking because I'm having joint issues that need to be dealt with by chiropractic care."  Says "I imagine it hasn't worked or it's gotten worse."  Notes "I've gained twelve pounds since I saw you in June."  Says "this pandemic has done a number on me; my hair is even falling out."   HPI:  Hyperlipidemia:  68 y.o. female here for cholesterol follow-up.   - Patient reports good compliance with treatment plan of:  medication and/ or lifestyle management.    - Patient denies any acute concerns or problems with management plan   - She denies new onset of: myalgias, arthralgias, increased fatigue more than normal, chest pains, exercise intolerance, shortness of breath, dizziness, visual changes, headache, lower extremity swelling or claudication.   Most recent cholesterol panel was:  Lab Results  Component Value Date   CHOL 233 (H) 06/09/2019   HDL 61 06/09/2019   LDLCALC 133 (H) 06/09/2019   TRIG 196 (H) 06/09/2019   CHOLHDL 3.8 06/09/2019   Hepatic Function Latest Ref Rng & Units 09/10/2019 06/09/2019 03/13/2018  Total Protein 6.5 - 8.1 g/dL 6.7 6.0 -  Albumin 3.5 - 5.0 g/dL 3.7 4.2 -  AST 15 - 41 U/L _0 ALT 0 - 44 U/L _1 Alk Phosphatase 38 - 126 U/L 67 65 58  Total Bilirubin 0.3 - 1.2 mg/dL 0.5 0.3 -     GAD 7 : Generalized Anxiety Score 06/11/2019 05/28/2019  Nervous, Anxious, on Edge 0 1  Control/stop worrying 0 0  Worry too much - different things 0 0  Trouble relaxing 0 0  Restless 0 0  Easily annoyed or irritable 0 0  Afraid - awful might happen 0 0  Total GAD 7 Score 0 1  Anxiety Difficulty Not difficult at all -    Depression screen [facility] 2/9 10/29/2019  10/13/2019 06/11/2019 05/28/2019  Decreased Interest 0 0 0 0  Down, Depressed, Hopeless 0 0 0 0  PHQ - 2 Score 0 0 0 0  Altered sleeping 1 0 0 1  Tired, decreased energy 0 _2 Change in appetite 0 3 0 0  Feeling bad or failure about yourself  0 0 0 0  Trouble concentrating 0 0 0 0  Moving slowly or fidgety/restless 0 0 0 0  Suicidal thoughts 0 0 0 0  PHQ-9 Score _3 Difficult doing work/chores Not difficult at all Not difficult at all Not difficult at all Not difficult at all      Impression and Recommendations:    1. Hyperlipidemia, unspecified hyperlipidemia type   2. Hypertriglyceridemia   3. Prediabetes   4. GAD (generalized anxiety disorder)   5. Vitamin D insufficiency       - Last visit July of 2020.  - Reviewed recent lab work (06/09/2019) in depth with patient today.  All lab work within normal limits unless otherwise noted.  Extensive education provided and all questions answered.  - A1c last obtained in June of 2020, less than 5 months ago, measured at 5.6.  Hyperlipidemia, Hypertriglyceridemia - Discussed that patient's cholesterol was abnormal and patient met qualifications to begin medication last OV.  Reviewed that patient agreed to make lifestyle changes and re-check her cholesterol.  - Education provided to patient today.  Discussed need to reduce risk of stroke and heart attack through prudent cholesterol management.  - Patient agrees to begin cholesterol medication today.  Patient states she would lean toward "five milligrams of generic Crestor" like her husband.  - To help reduce incidence of side effects on statin management, encouraged patient to drink adequate water and engage in regular physical activity, ideally a formal exercise routine.  - Re-check liver enzymes and cholesterol in 6-8 weeks since starting statin.  - Prudent dietary changes such as low saturated & trans fat diets for hyperlipidemia and low carb/ ketogenic diets for  hypertriglyceridemia discussed with patient.    - Encouraged patient to follow AHA guidelines for regular exercise and also engage in weight loss if BMI above 25.   - Educational handouts provided at patient's desire and/ or told to look online at the [place] website for further information.  - We will continue to monitor.  Vitamin D Deficiency - Patient has been taking 2000 IU's daily. - No changes made to management today.  - Will continue to monitor and re-check as recommended.  Historical Use of Benzodiazepines for Panic - Discussed prudent use of medications like alprazolam with patient during appointment. - Explained nature of controlled substance contract.  -  Asked patient whether she had been managed  on any  benzodiazepine aside from alprazolam in the past.  Per patient, she has only been managed on one prescription of alprazolam, sixty tablets prescribed in 2015 by her historical provider - Dr. Rex Kras.  - Per patient record, patient had clonazepam filled in January of 2019, for 15 tablets.  Was given another refill on 01/23/2018, clonazepam 30 tablets, and another refill in 3 of 2019, 30 more tablets of clonazepam etc etc.  Patient also received another refill of clonazepam in 03/2018.  She has received several scripts for benzos and she knows these meds very well/ very competent / intact memory etc.  - Patient stated she only had alprazolam filled in the past and denied being managed on clonazepam.  - Advised patient to return to talk about stress and mood management in depth, to evaluate safe options for intervention for chronic anxiety.  - Pt insisted on benzos being given today to her and I explained that I am unable to do so due to discrepancies in med history/ meds dispensed to pt versus what she is telling me she has had in the past.    Recommendations - Return for lab-only visit in 6-8 weeks as advised, and video OV to follow blood work.   Patient  Terminating Care with [place] -  When told pt about the fact that her medical record and history of taking benzos is actually different than what she depicted.   Patient was upset by this and asked where I was getting this information about meds given to her in the past..     -  Pt states she wishes to discontinue care at clinic today.   -  States she is dissatisfied with policies and practices at clinic.   -  Notes especially upset that she could not obtain care at clinic recently while she had COVID-19.     -  Discussion held in attempt to elucidate this matter with patient, but patient ended phone visit abruptly by hanging up the call on [place] in middle of me talking to her.  -  Patient will be discharged from the practice and will be no longer be allowed to see [place] for care due to her actions during today's OV.   - As part of my medical decision making, I reviewed the following data within the [place] History obtained from pt /family, CMA notes reviewed and incorporated if applicable, Labs reviewed, Radiograph/ tests reviewed if applicable and OV notes from prior OV's with me, as well as other specialists she/he has seen since seeing me last, were all reviewed and used in my medical decision making process today.    - Additionally, discussion had with patient regarding our treatment plan, and their biases/concerns about that plan were used in my medical decision making today.    - The patient agreed with the plan and demonstrated an understanding of the instructions.   No barriers to understanding were identified.    - Red flag symptoms and signs discussed in detail.  Patient expressed understanding regarding what to do in case of emergency\ urgent symptoms.   - The patient was advised to call back or seek an in-person evaluation if the symptoms worsen or if the condition fails to improve as anticipated.   Return for will not be returning.    No orders of the defined types were  placed in this encounter.   No orders of the defined types were placed in this encounter.   There are  no discontinued medications.    I provided 23+ minutes of non face-to-face time during this encounter.  Additional time was spent with charting and coordination of care after the actual visit commenced.   Note:  This note was prepared with assistance of Dragon voice recognition software. Occasional wrong-word or sound-a-like substitutions may have occurred due to the inherent limitations of voice recognition software.  This document serves as a record of services personally performed by Mellody Dance, DO. It was created on her behalf by Toni Amend, a trained medical scribe. The creation of this record is based on the scribe's personal observations and the provider's statements to them.   This case required medical decision making of at least moderate complexity. The above documentation has been reviewed to be accurate and was completed by Marjory Sneddon, D.O.      Patient Care Team    Relationship Specialty Notifications Start End  Tamsen Roers, MD Referring Physician Family Medicine  05/21/19      -Vitals obtained; medications/ allergies reconciled;  personal medical, social, Sx etc.histories were updated by CMA, reviewed by me and are reflected in chart   Patient Active Problem List   Diagnosis Date Noted  . Prediabetes 05/28/2019    Priority: High  . HLD (hyperlipidemia) 05/28/2019    Priority: High  . Elevated blood pressure reading 05/28/2019    Priority: High  . Bilateral hip bursitis 05/28/2019    Priority: Medium  . Depression, recurrent ([place]) 05/28/2019    Priority: Medium  . GAD (generalized anxiety disorder) with panic disorder 05/28/2019    Priority: Medium  . Chronic constipation- yrs and yrs.  05/28/2019    Priority: Low  . 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 Vitamin D insufficiency  07/26/2019  . Hypertriglyceridemia 07/26/2019  . Primary open angle glaucoma of left eye, severe stage 04/17/2018     Current Meds  Medication Sig  . ALPRAZolam (XANAX) 0.5 MG tablet Take 0.5 mg by mouth as needed for anxiety.  Marland Kitchen aspirin EC 325 MG tablet Take 650 mg by mouth daily.  . Calcium Carbonate (CALCIUM 600 PO) Take 1,200 mg by mouth.  . celecoxib (CELEBREX) 200 MG capsule   . cetirizine (ZYRTEC) 10 MG tablet Take 10 mg by mouth daily.  . Cholecalciferol (VITAMIN D) 50 MCG (2000 UT) tablet Take 2,000 Units by mouth daily.  . Flaxseed, Linseed, (FLAXSEED OIL) 1000 MG CAPS   . fluocinonide ointment (LIDEX) 0.05 %   . latanoprost (XALATAN) 0.005 % ophthalmic solution   . naproxen sodium (ALEVE) 220 MG tablet Take 440 mg by mouth at bedtime.  . Omega-3 Fatty Acids (FISH OIL PO) Take 1,000 mg by mouth daily.  . polyethylene glycol (MIRALAX / GLYCOLAX) packet Take 17 g by mouth daily as needed.  . Probiotic Product (PROBIOTIC-10 PO) Take 1 capsule by mouth daily.  . Red Yeast Rice Extract (RED YEAST RICE PO) Take 1 capsule by mouth daily.  . RESTASIS 0.05 % ophthalmic emulsion INSTILL 1 DROP INTO EACH EYE TWICE DAILY  . sertraline (ZOLOFT) 100 MG tablet Take 1 tablet (100 mg total) by mouth daily.  . timolol (TIMOPTIC) 0.5 % ophthalmic solution   . TRIAMCINOLONE ACETONIDE, TOP, 0.05 % OINT Apply topically.  . vitamin B-12 (CYANOCOBALAMIN) 1000 MCG tablet Take 1,000 mcg by mouth daily.   Current Facility-Administered Medications for the 10/29/19 encounter (Office Visit) with Mellody Dance, DO  Medication  . betamethasone acetate-betamethasone sodium  phosphate (CELESTONE) injection 3 mg     Allergies:  No Known Allergies   ROS:  See above HPI for pertinent positives and negatives   Objective:   Height _0  (1.626 m), weight 204 lb (92.5 kg).  (if some vitals are omitted, this means that patient was UNABLE to obtain them even though they were asked to get them prior to  OV today.  They were asked to call us at their earliest convenience with these once obtained. )  General: A & O * 3; sounds in no acute distress; in usual state of health.  Skin: Pt confirms warm and dry extremities and pink fingertips HEENT: Pt confirms lips non-cyanotic Chest: Patient confirms normal chest excursion and movement Respiratory: speaking in full sentences, no conversational dyspnea; patient confirms no use of accessory muscles Psych: insight appears good, mood- appears full

## 2019-11-02 DIAGNOSIS — M9904 Segmental and somatic dysfunction of sacral region: Secondary | ICD-10-CM | POA: Diagnosis not present

## 2019-11-02 DIAGNOSIS — M9903 Segmental and somatic dysfunction of lumbar region: Secondary | ICD-10-CM | POA: Diagnosis not present

## 2019-11-02 DIAGNOSIS — M62830 Muscle spasm of back: Secondary | ICD-10-CM | POA: Diagnosis not present

## 2019-11-02 DIAGNOSIS — M545 Low back pain: Secondary | ICD-10-CM | POA: Diagnosis not present

## 2019-11-04 DIAGNOSIS — M545 Low back pain: Secondary | ICD-10-CM | POA: Diagnosis not present

## 2019-11-04 DIAGNOSIS — M62830 Muscle spasm of back: Secondary | ICD-10-CM | POA: Diagnosis not present

## 2019-11-04 DIAGNOSIS — M9903 Segmental and somatic dysfunction of lumbar region: Secondary | ICD-10-CM | POA: Diagnosis not present

## 2019-11-04 DIAGNOSIS — M9904 Segmental and somatic dysfunction of sacral region: Secondary | ICD-10-CM | POA: Diagnosis not present

## 2019-11-10 DIAGNOSIS — M545 Low back pain: Secondary | ICD-10-CM | POA: Diagnosis not present

## 2019-11-10 DIAGNOSIS — M62830 Muscle spasm of back: Secondary | ICD-10-CM | POA: Diagnosis not present

## 2019-11-10 DIAGNOSIS — M9904 Segmental and somatic dysfunction of sacral region: Secondary | ICD-10-CM | POA: Diagnosis not present

## 2019-11-10 DIAGNOSIS — M9903 Segmental and somatic dysfunction of lumbar region: Secondary | ICD-10-CM | POA: Diagnosis not present

## 2019-11-12 DIAGNOSIS — M545 Low back pain: Secondary | ICD-10-CM | POA: Diagnosis not present

## 2019-11-12 DIAGNOSIS — M9904 Segmental and somatic dysfunction of sacral region: Secondary | ICD-10-CM | POA: Diagnosis not present

## 2019-11-12 DIAGNOSIS — M62830 Muscle spasm of back: Secondary | ICD-10-CM | POA: Diagnosis not present

## 2019-11-12 DIAGNOSIS — M9903 Segmental and somatic dysfunction of lumbar region: Secondary | ICD-10-CM | POA: Diagnosis not present

## 2019-11-16 DIAGNOSIS — M9904 Segmental and somatic dysfunction of sacral region: Secondary | ICD-10-CM | POA: Diagnosis not present

## 2019-11-16 DIAGNOSIS — M9903 Segmental and somatic dysfunction of lumbar region: Secondary | ICD-10-CM | POA: Diagnosis not present

## 2019-11-16 DIAGNOSIS — M545 Low back pain: Secondary | ICD-10-CM | POA: Diagnosis not present

## 2019-11-16 DIAGNOSIS — M62830 Muscle spasm of back: Secondary | ICD-10-CM | POA: Diagnosis not present

## 2019-11-19 DIAGNOSIS — M9903 Segmental and somatic dysfunction of lumbar region: Secondary | ICD-10-CM | POA: Diagnosis not present

## 2019-11-19 DIAGNOSIS — M545 Low back pain: Secondary | ICD-10-CM | POA: Diagnosis not present

## 2019-11-19 DIAGNOSIS — M62830 Muscle spasm of back: Secondary | ICD-10-CM | POA: Diagnosis not present

## 2019-11-19 DIAGNOSIS — M9904 Segmental and somatic dysfunction of sacral region: Secondary | ICD-10-CM | POA: Diagnosis not present

## 2019-11-26 DIAGNOSIS — M545 Low back pain: Secondary | ICD-10-CM | POA: Diagnosis not present

## 2019-11-26 DIAGNOSIS — M62830 Muscle spasm of back: Secondary | ICD-10-CM | POA: Diagnosis not present

## 2019-11-26 DIAGNOSIS — M9903 Segmental and somatic dysfunction of lumbar region: Secondary | ICD-10-CM | POA: Diagnosis not present

## 2019-11-26 DIAGNOSIS — M9904 Segmental and somatic dysfunction of sacral region: Secondary | ICD-10-CM | POA: Diagnosis not present

## 2019-11-30 DIAGNOSIS — M9903 Segmental and somatic dysfunction of lumbar region: Secondary | ICD-10-CM | POA: Diagnosis not present

## 2019-11-30 DIAGNOSIS — M545 Low back pain: Secondary | ICD-10-CM | POA: Diagnosis not present

## 2019-11-30 DIAGNOSIS — M62830 Muscle spasm of back: Secondary | ICD-10-CM | POA: Diagnosis not present

## 2019-11-30 DIAGNOSIS — M9904 Segmental and somatic dysfunction of sacral region: Secondary | ICD-10-CM | POA: Diagnosis not present

## 2019-12-01 DIAGNOSIS — L304 Erythema intertrigo: Secondary | ICD-10-CM | POA: Diagnosis not present

## 2019-12-02 DIAGNOSIS — M62830 Muscle spasm of back: Secondary | ICD-10-CM | POA: Diagnosis not present

## 2019-12-02 DIAGNOSIS — M9903 Segmental and somatic dysfunction of lumbar region: Secondary | ICD-10-CM | POA: Diagnosis not present

## 2019-12-02 DIAGNOSIS — M9904 Segmental and somatic dysfunction of sacral region: Secondary | ICD-10-CM | POA: Diagnosis not present

## 2019-12-02 DIAGNOSIS — M545 Low back pain: Secondary | ICD-10-CM | POA: Diagnosis not present

## 2019-12-07 DIAGNOSIS — M9904 Segmental and somatic dysfunction of sacral region: Secondary | ICD-10-CM | POA: Diagnosis not present

## 2019-12-07 DIAGNOSIS — M545 Low back pain: Secondary | ICD-10-CM | POA: Diagnosis not present

## 2019-12-07 DIAGNOSIS — M62830 Muscle spasm of back: Secondary | ICD-10-CM | POA: Diagnosis not present

## 2019-12-07 DIAGNOSIS — M9903 Segmental and somatic dysfunction of lumbar region: Secondary | ICD-10-CM | POA: Diagnosis not present

## 2019-12-09 DIAGNOSIS — M62830 Muscle spasm of back: Secondary | ICD-10-CM | POA: Diagnosis not present

## 2019-12-09 DIAGNOSIS — M545 Low back pain: Secondary | ICD-10-CM | POA: Diagnosis not present

## 2019-12-09 DIAGNOSIS — M9903 Segmental and somatic dysfunction of lumbar region: Secondary | ICD-10-CM | POA: Diagnosis not present

## 2019-12-09 DIAGNOSIS — M9904 Segmental and somatic dysfunction of sacral region: Secondary | ICD-10-CM | POA: Diagnosis not present

## 2020-01-04 DIAGNOSIS — F325 Major depressive disorder, single episode, in full remission: Secondary | ICD-10-CM | POA: Diagnosis not present

## 2020-01-05 DIAGNOSIS — H401131 Primary open-angle glaucoma, bilateral, mild stage: Secondary | ICD-10-CM | POA: Diagnosis not present

## 2021-03-02 IMAGING — DX DG CHEST 1V PORT
1 series · 1 of 1 positions shown · non-contrast
Comparison: 05/28/2016

CLINICAL DATA: Dry cough for 1 week.

EXAM:
PORTABLE CHEST 1 VIEW

[chest ap]
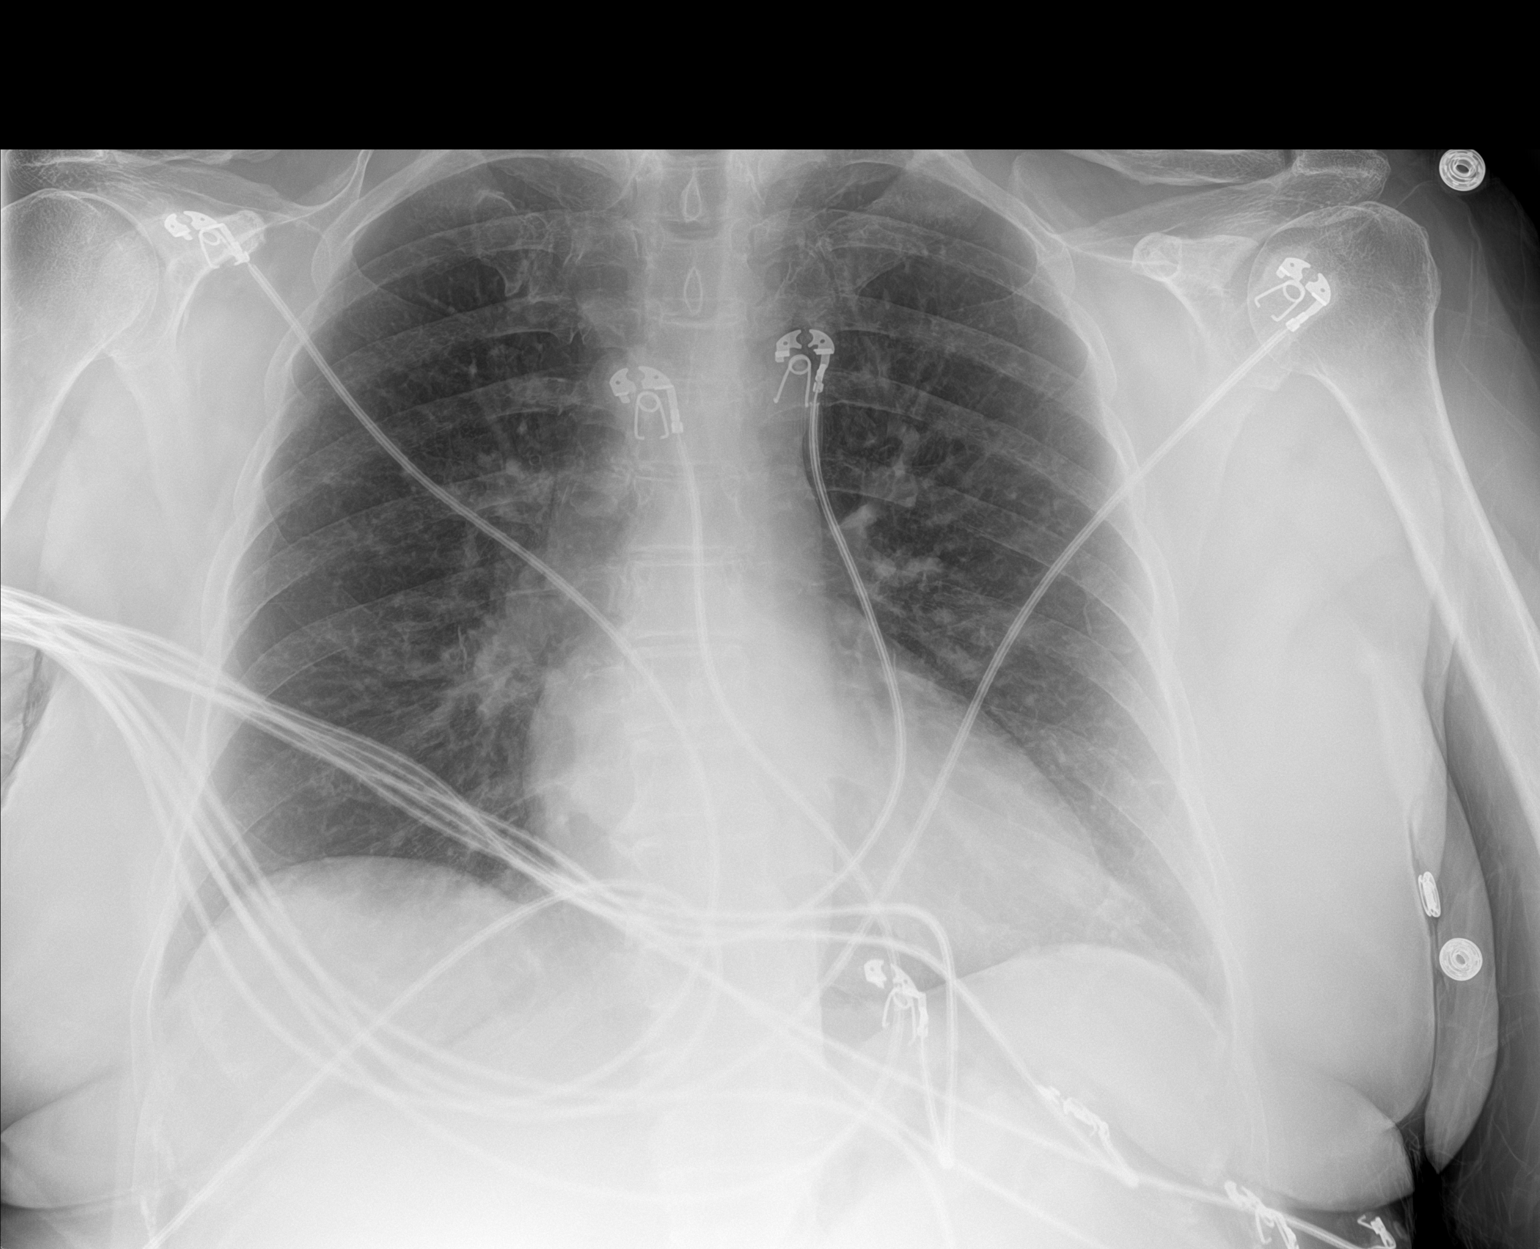

[1 of 1 positions shown; findings below may reference images not displayed]

FINDINGS: The heart size and mediastinal contours are within normal limits.
Both lungs are clear. The visualized skeletal structures are
unremarkable.
IMPRESSION: No active disease.

## 2021-06-13 ENCOUNTER — Ambulatory Visit: Payer: Medicare HMO | Admitting: Podiatry

## 2021-06-13 ENCOUNTER — Other Ambulatory Visit: Payer: Self-pay

## 2021-06-13 DIAGNOSIS — B351 Tinea unguium: Secondary | ICD-10-CM | POA: Diagnosis not present

## 2021-06-14 ENCOUNTER — Telehealth: Payer: Self-pay

## 2021-06-14 NOTE — Telephone Encounter (Signed)
Toenail specimen mailed to [facility] for fungal culture

## 2021-06-17 NOTE — Progress Notes (Signed)
  Subjective:  Patient ID: Deborah Fowler, female    DOB: [birth year],  MRN: [id number]  Chief Complaint  Patient presents with   Nail Problem    Toenails have become thick and discolored over the years    70 y.o. female presents with the above complaint. History confirmed with patient.  It is worse on the hallux nails  Objective:  Physical Exam: warm, good capillary refill, no trophic changes or ulcerative lesions, normal DP and PT pulses, and normal sensory exam. Dystrophic bilateral hallux nails Assessment:   1. Onychomycosis      Plan:  Patient was evaluated and treated and all questions answered.  Discussed treatment options of onychomycosis in detail with the patient.  We discussed oral and topical treatment.  I recommended culture of the nail plate and this was sent to [facility] pathology, will discuss further treatment options when we have the results at her next visit  Return in about 4 weeks (around 07/11/2021) for review culture results.

## 2021-06-26 ENCOUNTER — Telehealth: Payer: Self-pay | Admitting: *Deleted

## 2021-06-26 NOTE — Telephone Encounter (Signed)
Patient is calling for the results of [place] results. Please advise.

## 2021-06-27 NOTE — Telephone Encounter (Signed)
Patient is calling back for a return call concerning her nail lab results(in epic). Please advise.

## 2021-07-07 MED ORDER — TERBINAFINE HCL 250 MG PO TABS: 250.0000 mg | ORAL_TABLET | Freq: Every day | ORAL | 0 refills | Status: AC

## 2021-07-07 NOTE — Addendum Note (Signed)
Addended bySherryle Lis, Olimpia Tinch R on: 07/07/2021 09:00 AM   Modules accepted: Orders

## 2021-07-11 ENCOUNTER — Ambulatory Visit: Payer: Medicare HMO | Admitting: Podiatry

## 2021-10-02 ENCOUNTER — Telehealth: Payer: Self-pay | Admitting: *Deleted

## 2021-10-02 NOTE — Telephone Encounter (Signed)
Patient is calling because she has completed her medication,nails are improving slighly but what are the next steps moving forward in the treatment of her fungus.Please advise.

## 2021-10-02 NOTE — Telephone Encounter (Signed)
Thank you :)

## 2021-10-02 NOTE — Telephone Encounter (Signed)
Should have an appt made to evaluate if more is necessary

## 2021-10-02 NOTE — Telephone Encounter (Signed)
Please schedule for a f/u appointment w/ Dr Sherryle Lis

## 2021-10-05 ENCOUNTER — Ambulatory Visit: Payer: Medicare HMO | Admitting: Podiatry

## 2021-10-22 ENCOUNTER — Encounter: Payer: Self-pay | Admitting: Podiatry

## 2021-10-23 NOTE — Telephone Encounter (Signed)
Please advise 

## 2021-10-26 ENCOUNTER — Ambulatory Visit: Payer: Medicare HMO | Admitting: Podiatry

## 2021-10-26 MED ORDER — TERBINAFINE HCL 250 MG PO TABS: 250.0000 mg | ORAL_TABLET | Freq: Every day | ORAL | 0 refills | Status: AC

## 2021-11-20 ENCOUNTER — Other Ambulatory Visit: Payer: Self-pay | Admitting: Family Medicine

## 2021-11-20 ENCOUNTER — Other Ambulatory Visit: Payer: Self-pay

## 2021-11-20 ENCOUNTER — Ambulatory Visit
Admission: RE | Admit: 2021-11-20 | Discharge: 2021-11-20 | Disposition: A | Discharge status: Home / Self Care | Payer: Medicare HMO | Source: Ambulatory Visit | Attending: Family Medicine | Admitting: Family Medicine

## 2021-11-20 DIAGNOSIS — Z01818 Encounter for other preprocedural examination: Secondary | ICD-10-CM

## 2021-11-21 NOTE — Patient Instructions (Signed)
DUE TO COVID-19 ONLY ONE VISITOR IS ALLOWED TO COME WITH YOU AND STAY IN THE WAITING ROOM ONLY DURING PRE OP AND PROCEDURE DAY OF SURGERY.   Up to two visitors ages 16+ are allowed at one time in a patient's room.  The visitors may rotate out with other people throughout the day.  Additionally, up to two children between the ages of 69 and 85 are allowed and do not count toward the number of allowed visitors.  Children within this age range must be accompanied by an adult visitor.  One adult visitor may remain with the patient overnight and must be in the room by 8 PM.  YOU NEED TO HAVE A COVID 19 TEST ON__  12-23-222_____ between 8am-3pm______, THIS TEST MUST BE DONE BEFORE SURGERY,     Please bring completed form with you to the COVID testing site   COVID TESTING SITE [place] TEST IS COMPLETED,  PLEASE Wear a mask when in public           Your procedure is scheduled on: 12/05/21   Report to [facility] Main  Entrance   Report to admitting at    [place]  AM     Call this number if you have problems the morning of surgery [phone]   Remember: NO SOLID FOOD AFTER MIDNIGHT THE NIGHT PRIOR TO SURGERY. NOTHING BY MOUTH EXCEPT CLEAR LIQUIDS UNTIL   0415 am .  PLEASE FINISH  G2  DRINK PER SURGEON ORDER  WHICH NEEDS TO BE COMPLETED AT       0415 am then nothing by mouth .     CLEAR LIQUID DIET                                                                    water Black Coffee and tea, regular and decaf No Creamer                            Plain Jell-O any favor except red or purple                                  Fruit ices (not with fruit pulp)                                      Iced Popsicles                                     Carbonated beverages, regular and diet                                    Cranberry, grape and apple juices Sports drinks like Gatorade Lightly seasoned clear broth or consume(fat  free) Sugar, honey syrup  _____________________________________________________________________     BRUSH YOUR TEETH MORNING OF SURGERY AND RINSE YOUR MOUTH OUT,  NO CHEWING GUM CANDY OR MINTS.     Take these medicines the morning of surgery with A SIP OF WATER: eye drops as usual, setraline(zoloft), terbinafine(lamisil)  DO NOT TAKE ANY DIABETIC MEDICATIONS DAY OF YOUR SURGERY                               You may not have any metal on your body including hair pins and              piercings  Do not wear jewelry, make-up, lotions, powders,perfumes,  or      deodorant             Do not wear nail polish on your fingernails or toenails .  Do not shave  48 hours prior to surgery.            Do not bring valuables to the hospital. [place].  Contacts, dentures or bridgework may not be worn into surgery.  You may bring a small overnight bag with you     Patients discharged the day of surgery will not be allowed to drive home. IF YOU ARE HAVING SURGERY AND GOING HOME THE SAME DAY, YOU MUST HAVE AN ADULT TO DRIVE YOU HOME AND BE WITH YOU FOR 24 HOURS. YOU MAY GO HOME BY TAXI OR UBER OR ORTHERWISE, BUT AN ADULT MUST ACCOMPANY YOU HOME AND STAY WITH YOU FOR 24 HOURS.  Name and phone number of your driver:  Special Instructions: N/A              Please read over the following fact sheets you were given: _____________________________________________________________________             [facility] - Preparing for Surgery Before surgery, you can play an important role.  Because skin is not sterile, your skin needs to be as free of germs as possible.  You can reduce the number of germs on your skin by washing with CHG (chlorahexidine gluconate) soap before surgery.  CHG is an antiseptic cleaner which kills germs and bonds with the skin to continue killing germs even after washing. Please DO NOT use if you have an allergy to CHG or antibacterial  soaps.  If your skin becomes reddened/irritated stop using the CHG and inform your nurse when you arrive at Short Stay. Do not shave (including legs and underarms) for at least 48 hours prior to the first CHG shower.  You may shave your face/neck. Please follow these instructions carefully:  1.  Shower with CHG Soap the night before surgery and the  morning of Surgery.  2.  If you choose to wash your hair, wash your hair first as usual with your  normal  shampoo.  3.  After you shampoo, rinse your hair and body thoroughly to remove the  shampoo.                           4.  Use CHG as you would any other liquid soap.  You can apply chg directly  to the skin and wash                       Gently with a scrungie or clean washcloth.  5.  Apply the CHG Soap to your  body ONLY FROM THE NECK DOWN.   Do not use on face/ open                           Wound or open sores. Avoid contact with eyes, ears mouth and genitals (private parts).                       Wash face,  Genitals (private parts) with your normal soap.             6.  Wash thoroughly, paying special attention to the area where your surgery  will be performed.  7.  Thoroughly rinse your body with warm water from the neck down.  8.  DO NOT shower/wash with your normal soap after using and rinsing off  the CHG Soap.                9.  Pat yourself dry with a clean towel.            10.  Wear clean pajamas.            11.  Place clean sheets on your bed the night of your first shower and do not  sleep with pets. Day of Surgery : Do not apply any lotions/deodorants the morning of surgery.  Please wear clean clothes to the hospital/surgery center.  FAILURE TO FOLLOW THESE INSTRUCTIONS MAY RESULT IN THE CANCELLATION OF YOUR SURGERY PATIENT SIGNATURE_________________________________  NURSE SIGNATURE__________________________________  ________________________________________________________________________    Deborah Fowler  An  incentive spirometer is a tool that can help keep your lungs clear and active. This tool measures how well you are filling your lungs with each breath. Taking long deep breaths may help reverse or decrease the chance of developing breathing (pulmonary) problems (especially infection) following: A long period of time when you are unable to move or be active. BEFORE THE PROCEDURE  If the spirometer includes an indicator to show your best effort, your nurse or respiratory therapist will set it to a desired goal. If possible, sit up straight or lean slightly forward. Try not to slouch. Hold the incentive spirometer in an upright position. INSTRUCTIONS FOR USE  Sit on the edge of your bed if possible, or sit up as far as you can in bed or on a chair. Hold the incentive spirometer in an upright position. Breathe out normally. Place the mouthpiece in your mouth and seal your lips tightly around it. Breathe in slowly and as deeply as possible, raising the piston or the ball toward the top of the column. Hold your breath for 3-5 seconds or for as long as possible. Allow the piston or ball to fall to the bottom of the column. Remove the mouthpiece from your mouth and breathe out normally. Rest for a few seconds and repeat Steps 1 through 7 at least 10 times every 1-2 hours when you are awake. Take your time and take a few normal breaths between deep breaths. The spirometer may include an indicator to show your best effort. Use the indicator as a goal to work toward during each repetition. After each set of 10 deep breaths, practice coughing to be sure your lungs are clear. If you have an incision (the cut made at the time of surgery), support your incision when coughing by placing a pillow or rolled up towels firmly against it. Once you are able to get out of bed, walk around indoors and  cough well. You may stop using the incentive spirometer when instructed by your caregiver.  RISKS AND COMPLICATIONS Take  your time so you do not get dizzy or light-headed. If you are in pain, you may need to take or ask for pain medication before doing incentive spirometry. It is harder to take a deep breath if you are having pain. AFTER USE Rest and breathe slowly and easily. It can be helpful to keep track of a log of your progress. Your caregiver can provide you with a simple table to help with this. If you are using the spirometer at home, follow these instructions: [place] IF:  You are having difficultly using the spirometer. You have trouble using the spirometer as often as instructed. Your pain medication is not giving enough relief while using the spirometer. You develop fever of 100.5 F (38.1 C) or higher. SEEK IMMEDIATE MEDICAL CARE IF:  You cough up bloody sputum that had not been present before. You develop fever of 102 F (38.9 C) or greater. You develop worsening pain at or near the incision site. MAKE SURE YOU:  Understand these instructions. Will watch your condition. Will get help right away if you are not doing well or get worse. Document Released: 04/08/2007 Document Revised: 02/18/2012 Document Reviewed: 06/09/2007 [facility] Patient Information 2014 [place], [place].   ________________________________________________________________________

## 2021-11-21 NOTE — Progress Notes (Signed)
PCP -  Cardiologist -   PPM/ICD -  Device Orders -  Rep Notified -   Chest x-ray -  EKG -  Stress Test -  ECHO -  Cardiac Cath -   Sleep Study -  CPAP -   Fasting Blood Sugar -  Checks Blood Sugar _____ times a day  Blood Thinner Instructions: Aspirin Instructions:  ERAS Protcol - PRE-SURGERY Ensure or G2-   COVID TEST- 12-01-21 COVID vaccine -  Activity-- Anesthesia review:   Patient denies shortness of breath, fever, cough and chest pain at PAT appointment   All instructions explained to the patient, with a verbal understanding of the material. Patient agrees to go over the instructions while at home for a better understanding. Patient also instructed to self quarantine after being tested for COVID-19. The opportunity to ask questions was provided.

## 2021-11-24 NOTE — Progress Notes (Addendum)
DUE TO COVID-19 ONLY ONE VISITOR IS ALLOWED TO COME WITH YOU AND STAY IN THE WAITING ROOM ONLY DURING PRE OP AND PROCEDURE DAY OF SURGERY.  2 VISITOR  MAY VISIT WITH YOU AFTER SURGERY IN YOUR PRIVATE ROOM DURING VISITING HOURS ONLY!  YOU NEED TO HAVE A COVID 19 TEST ON_  12/01/2021 _ @_from  8am-3pm _____, THIS TEST MUST BE DONE BEFORE SURGERY,  Covid test is done at [place], [place] [address].  This is a drive thru.  No appt required. Please see map.                 Your procedure is scheduled on:       12/05/2021   Report to [facility] Main  Entrance   Report to admitting at    [phone]     Call this number if you have problems the morning of surgery [phone]    REMEMBER: NO  SOLID FOOD CANDY OR GUM AFTER MIDNIGHT. CLEAR LIQUIDS UNTIL   0400 am       . NOTHING BY MOUTH EXCEPT CLEAR LIQUIDS UNTIL    0400am . PLEASE FINISH  G2Lower sugar  DRINK PER SURGEON ORDER  WHICH NEEDS TO BE COMPLETED AT  0400am    .      CLEAR LIQUID DIET   Foods Allowed                                                                    Coffee and tea, regular and decaf                            Fruit ices (not with fruit pulp)                                      Iced Popsicles                                    Carbonated beverages, regular and diet                                    Cranberry, grape and apple juices Sports drinks like Gatorade Lightly seasoned clear broth or consume(fat free) Sugar, honey syrup ___________________________________________________________________      BRUSH YOUR TEETH MORNING OF SURGERY AND RINSE YOUR MOUTH OUT, NO CHEWING GUM CANDY OR MINTS.     Take these medicines the morning of surgery with A SIP OF WATER:  eye drops as usual zoloft, lamisil   DO NOT TAKE ANY DIABETIC MEDICATIONS DAY OF YOUR SURGERY                               You may not have any metal on your body including hair pins and              piercings  Do not wear  jewelry, make-up, lotions, powders or perfumes, deodorant  Do not wear nail polish on your fingernails.  Do not shave  48 hours prior to surgery.              Men may shave face and neck.   Do not bring valuables to the hospital. [place].  Contacts, dentures or bridgework may not be worn into surgery.  Leave suitcase in the car. After surgery it may be brought to your room.     Patients discharged the day of surgery will not be allowed to drive home. IF YOU ARE HAVING SURGERY AND GOING HOME THE SAME DAY, YOU MUST HAVE AN ADULT TO DRIVE YOU HOME AND BE WITH YOU FOR 24 HOURS. YOU MAY GO HOME BY TAXI OR UBER OR ORTHERWISE, BUT AN ADULT MUST ACCOMPANY YOU HOME AND STAY WITH YOU FOR 24 HOURS.  Name and phone number of your driver:  Special Instructions: N/A              Please read over the following fact sheets you were given: _____________________________________________________________________  [facility] - Preparing for Surgery Before surgery, you can play an important role.  Because skin is not sterile, your skin needs to be as free of germs as possible.  You can reduce the number of germs on your skin by washing with CHG (chlorahexidine gluconate) soap before surgery.  CHG is an antiseptic cleaner which kills germs and bonds with the skin to continue killing germs even after washing. Please DO NOT use if you have an allergy to CHG or antibacterial soaps.  If your skin becomes reddened/irritated stop using the CHG and inform your nurse when you arrive at Short Stay. Do not shave (including legs and underarms) for at least 48 hours prior to the first CHG shower.  You may shave your face/neck. Please follow these instructions carefully:  1.  Shower with CHG Soap the night before surgery and the  morning of Surgery.  2.  If you choose to wash your hair, wash your hair first as usual with your  normal  shampoo.  3.  After you shampoo,  rinse your hair and body thoroughly to remove the  shampoo.                           4.  Use CHG as you would any other liquid soap.  You can apply chg directly  to the skin and wash                       Gently with a scrungie or clean washcloth.  5.  Apply the CHG Soap to your body ONLY FROM THE NECK DOWN.   Do not use on face/ open                           Wound or open sores. Avoid contact with eyes, ears mouth and genitals (private parts).                       Wash face,  Genitals (private parts) with your normal soap.             6.  Wash thoroughly, paying special attention to the area where your surgery  will be performed.  7.  Thoroughly rinse your body with  warm water from the neck down.  8.  DO NOT shower/wash with your normal soap after using and rinsing off  the CHG Soap.                9.  Pat yourself dry with a clean towel.            10.  Wear clean pajamas.            11.  Place clean sheets on your bed the night of your first shower and do not  sleep with pets. Day of Surgery : Do not apply any lotions/deodorants the morning of surgery.  Please wear clean clothes to the hospital/surgery center.  FAILURE TO FOLLOW THESE INSTRUCTIONS MAY RESULT IN THE CANCELLATION OF YOUR SURGERY PATIENT SIGNATURE_________________________________  NURSE SIGNATURE__________________________________  ________________________________________________________________________

## 2021-11-24 NOTE — Progress Notes (Addendum)
Anesthesia Review:  PCP: DR  Bjorn Pippin in [place]  Clearance on chart dated 11/23/21.  12/8/22LOV note on chart.  Cardiologist : none  Chest x-ray : 11/20/21 on chart  EKG :11/16/21 on chart  Echo : Stress test: Cardiac Cath :  Activity level: can do a flgiht of stairs without difficulty  Sleep Study/ CPAP : none  Fasting Blood Sugar :      / Checks Blood Sugar -- times a day:   Blood Thinner/ Instructions /Last Dose: ASA / Instructions/ Last Dose :   Labs, ekg cxr and u/a done at [place] office 2 weeks ago per pt.  Called office and they are faxing all labs.   Covid test on 12/01/21.   Hgba1c-11/16/21- 5.8 on chart  Labs of cbc/diff,pt, cmp dated 11/16/21 on chart.

## 2021-11-27 ENCOUNTER — Other Ambulatory Visit: Payer: Self-pay

## 2021-11-27 ENCOUNTER — Encounter (HOSPITAL_COMMUNITY): Payer: Self-pay

## 2021-11-27 ENCOUNTER — Encounter (HOSPITAL_COMMUNITY)
Admission: RE | Admit: 2021-11-27 | Discharge: 2021-11-27 | Disposition: A | Discharge status: Home / Self Care | Payer: Medicare HMO | Source: Ambulatory Visit | Attending: Orthopedic Surgery | Admitting: Orthopedic Surgery

## 2021-11-27 VITALS — BP 147/88 | HR 71 | Temp 98.2°F | Resp 16 | Ht 63.0 in

## 2021-11-27 DIAGNOSIS — R7303 Prediabetes: Secondary | ICD-10-CM | POA: Insufficient documentation

## 2021-11-27 DIAGNOSIS — Z01818 Encounter for other preprocedural examination: Secondary | ICD-10-CM

## 2021-11-27 DIAGNOSIS — M1711 Unilateral primary osteoarthritis, right knee: Secondary | ICD-10-CM | POA: Diagnosis not present

## 2021-11-27 DIAGNOSIS — Z01812 Encounter for preprocedural laboratory examination: Secondary | ICD-10-CM | POA: Insufficient documentation

## 2021-11-27 HISTORY — DX: Depression, unspecified: F32.A

## 2021-11-27 LAB — SURGICAL PCR SCREEN
MRSA, PCR: NEGATIVE
Staphylococcus aureus: NEGATIVE

## 2021-12-05 DIAGNOSIS — M1711 Unilateral primary osteoarthritis, right knee: Secondary | ICD-10-CM

## 2021-12-05 LAB — TYPE AND SCREEN
ABO/RH(D): A POS
Antibody Screen: NEGATIVE

## 2022-01-01 NOTE — Progress Notes (Signed)
Covid test on 01/12/22 at 0945am come thru main entrance at [place] [facility].  Have a seat in the lobby on the right as you come thru the door.  Call [phone] and let them know you are here for covid testing.             Your procedure is scheduled on:           01/16/22   Report to [facility] Main  Entrance   Report to admitting at   115pm     Call this number if you have problems the morning of surgery [phone]    REMEMBER: NO  SOLID FOOD CANDY OR GUM AFTER MIDNIGHT. CLEAR LIQUIDS UNTIL  100pm       . NOTHING BY MOUTH EXCEPT CLEAR LIQUIDS UNTIL  100 pm  . PLEASE FINISH ENSURE DRINK PER SURGEON ORDER  WHICH NEEDS TO BE COMPLETED AT   100pm .      CLEAR LIQUID DIET   Foods Allowed                                                                    Coffee and tea, regular and decaf                            Fruit ices (not with fruit pulp)                                      Iced Popsicles                                    Carbonated beverages, regular and diet                                    Cranberry, grape and apple juices Sports drinks like Gatorade Lightly seasoned clear broth or consume(fat free) Sugar, honey syrup ___________________________________________________________________      BRUSH YOUR TEETH MORNING OF SURGERY AND RINSE YOUR MOUTH OUT, NO CHEWING GUM CANDY OR MINTS.     Take these medicines the morning of surgery with A SIP OF WATER:  zoloft, eye drops as usual   DO NOT TAKE ANY DIABETIC MEDICATIONS DAY OF YOUR SURGERY                               You may not have any metal on your body including hair pins and              piercings  Do not wear jewelry, make-up, lotions, powders or perfumes, deodorant             Do not wear nail polish on your fingernails.  Do not shave  48 hours prior to surgery.              Men may shave face and neck.   Do not bring valuables to the hospital. [place] IS NOT  RESPONSIBLE   FOR  VALUABLES.  Contacts, dentures or bridgework may not be worn into surgery.  Leave suitcase in the car. After surgery it may be brought to your room.     Patients discharged the day of surgery will not be allowed to drive home. IF YOU ARE HAVING SURGERY AND GOING HOME THE SAME DAY, YOU MUST HAVE AN ADULT TO DRIVE YOU HOME AND BE WITH YOU FOR 24 HOURS. YOU MAY GO HOME BY TAXI OR UBER OR ORTHERWISE, BUT AN ADULT MUST ACCOMPANY YOU HOME AND STAY WITH YOU FOR 24 HOURS.  Name and phone number of your driver:  Special Instructions: N/A              Please read over the following fact sheets you were given: _____________________________________________________________________  [facility] - Preparing for Surgery Before surgery, you can play an important role.  Because skin is not sterile, your skin needs to be as free of germs as possible.  You can reduce the number of germs on your skin by washing with CHG (chlorahexidine gluconate) soap before surgery.  CHG is an antiseptic cleaner which kills germs and bonds with the skin to continue killing germs even after washing. Please DO NOT use if you have an allergy to CHG or antibacterial soaps.  If your skin becomes reddened/irritated stop using the CHG and inform your nurse when you arrive at Short Stay. Do not shave (including legs and underarms) for at least 48 hours prior to the first CHG shower.  You may shave your face/neck. Please follow these instructions carefully:  1.  Shower with CHG Soap the night before surgery and the  morning of Surgery.  2.  If you choose to wash your hair, wash your hair first as usual with your  normal  shampoo.  3.  After you shampoo, rinse your hair and body thoroughly to remove the  shampoo.                           4.  Use CHG as you would any other liquid soap.  You can apply chg directly  to the skin and wash                       Gently with a scrungie or clean washcloth.  5.  Apply the CHG Soap to your body ONLY  FROM THE NECK DOWN.   Do not use on face/ open                           Wound or open sores. Avoid contact with eyes, ears mouth and genitals (private parts).                       Wash face,  Genitals (private parts) with your normal soap.             6.  Wash thoroughly, paying special attention to the area where your surgery  will be performed.  7.  Thoroughly rinse your body with warm water from the neck down.  8.  DO NOT shower/wash with your normal soap after using and rinsing off  the CHG Soap.                9.  Pat yourself dry with a clean towel.            10.  Wear clean pajamas.            11.  Place clean sheets on your bed the night of your first shower and do not  sleep with pets. Day of Surgery : Do not apply any lotions/deodorants the morning of surgery.  Please wear clean clothes to the hospital/surgery center.  FAILURE TO FOLLOW THESE INSTRUCTIONS MAY RESULT IN THE CANCELLATION OF YOUR SURGERY PATIENT SIGNATURE_________________________________  NURSE SIGNATURE__________________________________  ________________________________________________________________________

## 2022-01-01 NOTE — Progress Notes (Addendum)
Anesthesia Review:  PCP: DR Deborah Fowler- clearance on chart 11/23/21 LOV on chart dated 11/16/21  Cardiologist : Chest x-ray : EKG : 11/16/21 on chart  Echo : Stress test: Cardiac Cath :  Activity level: can do a flight of stairs without difficulty  Sleep Study/ CPAP : none  Fasting Blood Sugar :      / Checks Blood Sugar -- times a day:   Blood Thinner/ Instructions /Last Dose: ASA / Instructions/ Last Dose :   No covid test positive for covid on 11/28/2021.  Seen by Dr Deborah Fowler positive home test  [facility] and requested ov note from 11/28/21.  On chart dated 11/28/21.

## 2022-01-03 ENCOUNTER — Encounter (HOSPITAL_COMMUNITY): Payer: Self-pay

## 2022-01-03 ENCOUNTER — Other Ambulatory Visit: Payer: Self-pay

## 2022-01-03 ENCOUNTER — Encounter (HOSPITAL_COMMUNITY)
Admission: RE | Admit: 2022-01-03 | Discharge: 2022-01-03 | Disposition: A | Discharge status: Home / Self Care | Payer: Medicare HMO | Source: Ambulatory Visit | Attending: Orthopedic Surgery | Admitting: Orthopedic Surgery

## 2022-01-03 VITALS — BP 124/81 | HR 75 | Temp 98.3°F | Resp 16 | Ht 63.0 in | Wt 195.0 lb

## 2022-01-03 DIAGNOSIS — Z01818 Encounter for other preprocedural examination: Secondary | ICD-10-CM

## 2022-01-03 DIAGNOSIS — Z01812 Encounter for preprocedural laboratory examination: Secondary | ICD-10-CM | POA: Diagnosis not present

## 2022-01-03 HISTORY — DX: Nausea with vomiting, unspecified: R11.2

## 2022-01-03 LAB — COMPREHENSIVE METABOLIC PANEL
ALT: 28 U/L (ref 0–44)
AST: 25 U/L (ref 15–41)
Albumin: 4.2 g/dL (ref 3.5–5.0)
Alkaline Phosphatase: 50 U/L (ref 38–126)
Anion gap: 7 (ref 5–15)
BUN: 17 mg/dL (ref 8–23)
CO2: 27 mmol/L (ref 22–32)
Calcium: 9.4 mg/dL (ref 8.9–10.3)
Chloride: 104 mmol/L (ref 98–111)
Creatinine, Ser: 0.66 mg/dL (ref 0.44–1.00)
GFR, Estimated: >60 mL/min (ref >60)
Glucose, Bld: 103 mg/dL — ABNORMAL HIGH (ref 70–99)
Potassium: 4.1 mmol/L (ref 3.5–5.1)
Sodium: 138 mmol/L (ref 135–145)
Total Bilirubin: 0.5 mg/dL (ref 0.3–1.2)
Total Protein: 7.1 g/dL (ref 6.5–8.1)

## 2022-01-03 LAB — TYPE AND SCREEN
ABO/RH(D): A POS
Antibody Screen: NEGATIVE

## 2022-01-03 LAB — CBC
HCT: 41.2 % (ref 36.0–46.0)
Hemoglobin: 13.5 g/dL (ref 12.0–15.0)
MCH: 29.9 pg (ref 26.0–34.0)
MCHC: 32.8 g/dL (ref 30.0–36.0)
MCV: 91.4 fL (ref 80.0–100.0)
Platelets: 289 10*3/uL (ref 150–400)
RBC: 4.51 MIL/uL (ref 3.87–5.11)
RDW: 13.5 % (ref 11.5–15.5)
WBC: 5.9 10*3/uL (ref 4.0–10.5)
nRBC: 0 % (ref 0.0–0.2)

## 2022-01-03 LAB — SURGICAL PCR SCREEN
MRSA, PCR: NEGATIVE
Staphylococcus aureus: NEGATIVE

## 2022-01-12 ENCOUNTER — Encounter (HOSPITAL_COMMUNITY): Payer: Medicare HMO

## 2022-01-16 ENCOUNTER — Ambulatory Visit (HOSPITAL_COMMUNITY): Payer: Medicare HMO | Admitting: Physician Assistant

## 2022-01-16 ENCOUNTER — Encounter (HOSPITAL_COMMUNITY): Payer: Self-pay | Admitting: Orthopedic Surgery

## 2022-01-16 ENCOUNTER — Observation Stay (HOSPITAL_COMMUNITY)
Admission: RE | Admit: 2022-01-16 | Discharge: 2022-01-17 | Disposition: A | Discharge status: Home / Self Care | Payer: Medicare HMO | Source: Ambulatory Visit | Attending: Orthopedic Surgery | Admitting: Orthopedic Surgery

## 2022-01-16 ENCOUNTER — Other Ambulatory Visit: Payer: Self-pay

## 2022-01-16 ENCOUNTER — Encounter (HOSPITAL_COMMUNITY)
Admission: RE | Disposition: A | Discharge status: Home / Self Care | Payer: Self-pay | Source: Ambulatory Visit | Attending: Orthopedic Surgery

## 2022-01-16 DIAGNOSIS — R262 Difficulty in walking, not elsewhere classified: Secondary | ICD-10-CM | POA: Diagnosis not present

## 2022-01-16 DIAGNOSIS — Z87891 Personal history of nicotine dependence: Secondary | ICD-10-CM | POA: Insufficient documentation

## 2022-01-16 DIAGNOSIS — E669 Obesity, unspecified: Secondary | ICD-10-CM | POA: Diagnosis not present

## 2022-01-16 DIAGNOSIS — M1711 Unilateral primary osteoarthritis, right knee: Secondary | ICD-10-CM | POA: Diagnosis not present

## 2022-01-16 DIAGNOSIS — Z96651 Presence of right artificial knee joint: Secondary | ICD-10-CM

## 2022-01-16 DIAGNOSIS — F419 Anxiety disorder, unspecified: Secondary | ICD-10-CM | POA: Diagnosis not present

## 2022-01-16 HISTORY — PX: TOTAL KNEE ARTHROPLASTY: SHX125

## 2022-01-16 SURGERY — ARTHROPLASTY, KNEE, TOTAL
Anesthesia: Spinal | Site: Knee | Laterality: Right

## 2022-01-16 MED ORDER — ACETAMINOPHEN 500 MG PO TABS: 1000.0000 mg | ORAL_TABLET | Freq: Once | ORAL | Status: AC

## 2022-01-16 MED ORDER — METHOCARBAMOL 1000 MG/10ML IJ SOLN
500.0000 mg | Freq: Four times a day (QID) | INTRAVENOUS | Status: DC | PRN
  Filled 2022-01-16: qty 5

## 2022-01-16 MED ORDER — ONDANSETRON HCL 4 MG PO TABS: 4.0000 mg | ORAL_TABLET | Freq: Four times a day (QID) | ORAL | Status: DC | PRN

## 2022-01-16 MED ORDER — OXYCODONE HCL 5 MG PO TABS
10.0000 mg | ORAL_TABLET | ORAL | Status: DC | PRN
  Administered 2022-01-17 (×3): 15 mg via ORAL
  Filled 2022-01-16 (×3): qty 3

## 2022-01-16 MED ORDER — SODIUM CHLORIDE 0.9 % IV SOLN: INTRAVENOUS | Status: DC

## 2022-01-16 MED ORDER — METHOCARBAMOL 500 MG PO TABS
500.0000 mg | ORAL_TABLET | Freq: Four times a day (QID) | ORAL | Status: DC | PRN
  Administered 2022-01-16 – 2022-01-17 (×2): 500 mg via ORAL
  Filled 2022-01-16 (×2): qty 1

## 2022-01-16 MED ORDER — PHENOL 1.4 % MT LIQD: 1.0000 | OROMUCOSAL | Status: DC | PRN

## 2022-01-16 MED ORDER — LATANOPROST 0.005 % OP SOLN
1.0000 [drp] | Freq: Every day | OPHTHALMIC | Status: DC
  Administered 2022-01-16: 1 [drp] via OPHTHALMIC
  Filled 2022-01-16: qty 2.5

## 2022-01-16 MED ORDER — CLONIDINE HCL (ANALGESIA) 100 MCG/ML EP SOLN
EPIDURAL | Status: DC | PRN
  Administered 2022-01-16: 80 ug

## 2022-01-16 MED ORDER — TRANEXAMIC ACID-NACL 1000-0.7 MG/100ML-% IV SOLN
1000.0000 mg | INTRAVENOUS | Status: AC
  Administered 2022-01-16: 1000 mg via INTRAVENOUS
  Filled 2022-01-16: qty 100

## 2022-01-16 MED ORDER — PROPOFOL 1000 MG/100ML IV EMUL
INTRAVENOUS | Status: AC
  Filled 2022-01-16: qty 100

## 2022-01-16 MED ORDER — MENTHOL 3 MG MT LOZG: 1.0000 | LOZENGE | OROMUCOSAL | Status: DC | PRN

## 2022-01-16 MED ORDER — CEFAZOLIN SODIUM-DEXTROSE 2-4 GM/100ML-% IV SOLN
2.0000 g | Freq: Four times a day (QID) | INTRAVENOUS | Status: AC
  Administered 2022-01-16 – 2022-01-17 (×2): 2 g via INTRAVENOUS
  Filled 2022-01-16 (×2): qty 100

## 2022-01-16 MED ORDER — EPHEDRINE SULFATE-NACL 50-0.9 MG/10ML-% IV SOSY
PREFILLED_SYRINGE | INTRAVENOUS | Status: DC | PRN
  Administered 2022-01-16 (×5): 5 mg via INTRAVENOUS

## 2022-01-16 MED ORDER — OXYCODONE HCL 5 MG PO TABS
5.0000 mg | ORAL_TABLET | ORAL | Status: DC | PRN
  Administered 2022-01-16: 10 mg via ORAL
  Filled 2022-01-16: qty 2

## 2022-01-16 MED ORDER — DOCUSATE SODIUM 100 MG PO CAPS
100.0000 mg | ORAL_CAPSULE | Freq: Two times a day (BID) | ORAL | Status: DC
  Administered 2022-01-16 – 2022-01-17 (×2): 100 mg via ORAL
  Filled 2022-01-16 (×2): qty 1

## 2022-01-16 MED ORDER — METOCLOPRAMIDE HCL 5 MG PO TABS: 5.0000 mg | ORAL_TABLET | Freq: Three times a day (TID) | ORAL | Status: DC | PRN

## 2022-01-16 MED ORDER — STERILE WATER FOR IRRIGATION IR SOLN
Status: DC | PRN
  Administered 2022-01-16 (×2): 1000 mL

## 2022-01-16 MED ORDER — ACETAMINOPHEN 325 MG PO TABS: 325.0000 mg | ORAL_TABLET | Freq: Four times a day (QID) | ORAL | Status: DC | PRN

## 2022-01-16 MED ORDER — POVIDONE-IODINE 10 % EX SWAB
2.0000 "application " | Freq: Once | CUTANEOUS | Status: AC
  Administered 2022-01-16: 2 via TOPICAL

## 2022-01-16 MED ORDER — DEXAMETHASONE SODIUM PHOSPHATE 10 MG/ML IJ SOLN
8.0000 mg | Freq: Once | INTRAMUSCULAR | Status: AC
  Administered 2022-01-16: 8 mg via INTRAVENOUS

## 2022-01-16 MED ORDER — DIPHENHYDRAMINE HCL 12.5 MG/5ML PO ELIX: 12.5000 mg | ORAL_SOLUTION | ORAL | Status: DC | PRN

## 2022-01-16 MED ORDER — FENTANYL CITRATE PF 50 MCG/ML IJ SOSY
50.0000 ug | PREFILLED_SYRINGE | INTRAMUSCULAR | Status: AC
  Administered 2022-01-16: 100 ug via INTRAVENOUS
  Filled 2022-01-16: qty 2

## 2022-01-16 MED ORDER — POLYVINYL ALCOHOL 1.4 % OP SOLN
1.0000 [drp] | Freq: Two times a day (BID) | OPHTHALMIC | Status: DC | PRN
  Filled 2022-01-16: qty 15

## 2022-01-16 MED ORDER — PROPOFOL 10 MG/ML IV BOLUS
INTRAVENOUS | Status: DC | PRN
  Administered 2022-01-16 (×2): 20 mg via INTRAVENOUS

## 2022-01-16 MED ORDER — SODIUM CHLORIDE (PF) 0.9 % IJ SOLN
INTRAMUSCULAR | Status: DC | PRN
  Administered 2022-01-16: 30 mL

## 2022-01-16 MED ORDER — TIMOLOL MALEATE 0.5 % OP SOLN
1.0000 [drp] | Freq: Every day | OPHTHALMIC | Status: DC
  Administered 2022-01-16: 1 [drp] via OPHTHALMIC
  Filled 2022-01-16: qty 5

## 2022-01-16 MED ORDER — SODIUM CHLORIDE 0.9 % IR SOLN
Status: DC | PRN
Start: 2022-01-16 — End: 2022-01-16
  Administered 2022-01-16: 1000 mL

## 2022-01-16 MED ORDER — PHENYLEPHRINE 40 MCG/ML (10ML) SYRINGE FOR IV PUSH (FOR BLOOD PRESSURE SUPPORT)
PREFILLED_SYRINGE | INTRAVENOUS | Status: AC
  Filled 2022-01-16: qty 10

## 2022-01-16 MED ORDER — LACTATED RINGERS IV SOLN: INTRAVENOUS | Status: DC

## 2022-01-16 MED ORDER — ALPRAZOLAM 0.5 MG PO TABS: 0.5000 mg | ORAL_TABLET | Freq: Every evening | ORAL | Status: DC | PRN

## 2022-01-16 MED ORDER — ACETAMINOPHEN 500 MG PO TABS
ORAL_TABLET | ORAL | Status: AC
  Administered 2022-01-16: 1000 mg via ORAL
  Filled 2022-01-16: qty 2

## 2022-01-16 MED ORDER — CEFAZOLIN SODIUM-DEXTROSE 2-4 GM/100ML-% IV SOLN
2.0000 g | INTRAVENOUS | Status: AC
  Administered 2022-01-16: 2 g via INTRAVENOUS
  Filled 2022-01-16: qty 100

## 2022-01-16 MED ORDER — ONDANSETRON HCL 4 MG/2ML IJ SOLN
INTRAMUSCULAR | Status: AC
  Filled 2022-01-16: qty 2

## 2022-01-16 MED ORDER — SERTRALINE HCL 100 MG PO TABS
100.0000 mg | ORAL_TABLET | Freq: Every day | ORAL | Status: DC
  Administered 2022-01-16: 100 mg via ORAL
  Filled 2022-01-16: qty 1

## 2022-01-16 MED ORDER — KETOROLAC TROMETHAMINE 30 MG/ML IJ SOLN
INTRAMUSCULAR | Status: DC | PRN
  Administered 2022-01-16: 30 mg

## 2022-01-16 MED ORDER — CHLORHEXIDINE GLUCONATE 0.12 % MT SOLN: 15.0000 mL | Freq: Once | OROMUCOSAL | Status: DC

## 2022-01-16 MED ORDER — FENTANYL CITRATE PF 50 MCG/ML IJ SOSY: 25.0000 ug | PREFILLED_SYRINGE | INTRAMUSCULAR | Status: DC | PRN

## 2022-01-16 MED ORDER — DEXAMETHASONE SODIUM PHOSPHATE 4 MG/ML IJ SOLN
INTRAMUSCULAR | Status: DC | PRN
  Administered 2022-01-16: 5 mg via PERINEURAL

## 2022-01-16 MED ORDER — PROMETHAZINE HCL 25 MG/ML IJ SOLN: 6.2500 mg | INTRAMUSCULAR | Status: DC | PRN

## 2022-01-16 MED ORDER — CHLORHEXIDINE GLUCONATE 0.12 % MT SOLN
15.0000 mL | Freq: Once | OROMUCOSAL | Status: AC
  Administered 2022-01-16: 15 mL via OROMUCOSAL

## 2022-01-16 MED ORDER — BUPIVACAINE-EPINEPHRINE (PF) 0.25% -1:200000 IJ SOLN
INTRAMUSCULAR | Status: DC | PRN
  Administered 2022-01-16: 30 mL via PERINEURAL

## 2022-01-16 MED ORDER — ORAL CARE MOUTH RINSE: 15.0000 mL | Freq: Once | OROMUCOSAL | Status: DC

## 2022-01-16 MED ORDER — FERROUS SULFATE 325 (65 FE) MG PO TABS
325.0000 mg | ORAL_TABLET | Freq: Three times a day (TID) | ORAL | Status: DC
  Administered 2022-01-17 (×2): 325 mg via ORAL
  Filled 2022-01-16 (×2): qty 1

## 2022-01-16 MED ORDER — METOCLOPRAMIDE HCL 5 MG/ML IJ SOLN: 5.0000 mg | Freq: Three times a day (TID) | INTRAMUSCULAR | Status: DC | PRN

## 2022-01-16 MED ORDER — EPHEDRINE 5 MG/ML INJ
INTRAVENOUS | Status: AC
  Filled 2022-01-16: qty 5

## 2022-01-16 MED ORDER — TRANEXAMIC ACID-NACL 1000-0.7 MG/100ML-% IV SOLN
1000.0000 mg | Freq: Once | INTRAVENOUS | Status: AC
  Administered 2022-01-16: 1000 mg via INTRAVENOUS
  Filled 2022-01-16: qty 100

## 2022-01-16 MED ORDER — LORATADINE 10 MG PO TABS
10.0000 mg | ORAL_TABLET | Freq: Every day | ORAL | Status: DC
  Administered 2022-01-17: 10 mg via ORAL
  Filled 2022-01-16: qty 1

## 2022-01-16 MED ORDER — ONDANSETRON HCL 4 MG/2ML IJ SOLN: 4.0000 mg | Freq: Four times a day (QID) | INTRAMUSCULAR | Status: DC | PRN

## 2022-01-16 MED ORDER — PHENYLEPHRINE 40 MCG/ML (10ML) SYRINGE FOR IV PUSH (FOR BLOOD PRESSURE SUPPORT)
PREFILLED_SYRINGE | INTRAVENOUS | Status: DC | PRN
  Administered 2022-01-16 (×2): 40 ug via INTRAVENOUS
  Administered 2022-01-16 (×3): 80 ug via INTRAVENOUS

## 2022-01-16 MED ORDER — FAMOTIDINE 20 MG PO TABS
20.0000 mg | ORAL_TABLET | Freq: Two times a day (BID) | ORAL | Status: DC
  Administered 2022-01-16 – 2022-01-17 (×2): 20 mg via ORAL
  Filled 2022-01-16 (×2): qty 1

## 2022-01-16 MED ORDER — 0.9 % SODIUM CHLORIDE (POUR BTL) OPTIME
TOPICAL | Status: DC | PRN
Start: 2022-01-16 — End: 2022-01-16
  Administered 2022-01-16: 1000 mL

## 2022-01-16 MED ORDER — BUPIVACAINE-EPINEPHRINE (PF) 0.25% -1:200000 IJ SOLN
INTRAMUSCULAR | Status: AC
  Filled 2022-01-16: qty 30

## 2022-01-16 MED ORDER — CELECOXIB 200 MG PO CAPS
200.0000 mg | ORAL_CAPSULE | Freq: Two times a day (BID) | ORAL | Status: DC
  Administered 2022-01-16 – 2022-01-17 (×2): 200 mg via ORAL
  Filled 2022-01-16 (×2): qty 1

## 2022-01-16 MED ORDER — ORAL CARE MOUTH RINSE: 15.0000 mL | Freq: Once | OROMUCOSAL | Status: AC

## 2022-01-16 MED ORDER — ASPIRIN 81 MG PO CHEW
81.0000 mg | CHEWABLE_TABLET | Freq: Two times a day (BID) | ORAL | Status: DC
  Administered 2022-01-16 – 2022-01-17 (×2): 81 mg via ORAL
  Filled 2022-01-16 (×2): qty 1

## 2022-01-16 MED ORDER — DEXAMETHASONE SODIUM PHOSPHATE 10 MG/ML IJ SOLN
10.0000 mg | Freq: Once | INTRAMUSCULAR | Status: AC
  Administered 2022-01-17: 10 mg via INTRAVENOUS
  Filled 2022-01-16: qty 1

## 2022-01-16 MED ORDER — POLYETHYLENE GLYCOL 3350 17 G PO PACK: 17.0000 g | PACK | Freq: Every day | ORAL | Status: DC | PRN

## 2022-01-16 MED ORDER — ROPIVACAINE HCL 7.5 MG/ML IJ SOLN
INTRAMUSCULAR | Status: DC | PRN
  Administered 2022-01-16: 20 mL via PERINEURAL

## 2022-01-16 MED ORDER — HYDROMORPHONE HCL 1 MG/ML IJ SOLN
0.5000 mg | INTRAMUSCULAR | Status: DC | PRN
  Administered 2022-01-16 (×2): 1 mg via INTRAVENOUS
  Filled 2022-01-16 (×2): qty 1

## 2022-01-16 MED ORDER — BUPIVACAINE IN DEXTROSE 0.75-8.25 % IT SOLN
INTRATHECAL | Status: DC | PRN
  Administered 2022-01-16: 1.5 mL via INTRATHECAL

## 2022-01-16 MED ORDER — PROPOFOL 500 MG/50ML IV EMUL
INTRAVENOUS | Status: DC | PRN
  Administered 2022-01-16: 100 ug/kg/min via INTRAVENOUS

## 2022-01-16 MED ORDER — CYCLOSPORINE 0.05 % OP EMUL
1.0000 [drp] | Freq: Two times a day (BID) | OPHTHALMIC | Status: DC
  Administered 2022-01-16 – 2022-01-17 (×2): 1 [drp] via OPHTHALMIC
  Filled 2022-01-16 (×2): qty 30

## 2022-01-16 MED ORDER — DEXAMETHASONE SODIUM PHOSPHATE 10 MG/ML IJ SOLN
INTRAMUSCULAR | Status: AC
  Filled 2022-01-16: qty 1

## 2022-01-16 MED ORDER — BISACODYL 10 MG RE SUPP: 10.0000 mg | Freq: Every day | RECTAL | Status: DC | PRN

## 2022-01-16 MED ORDER — MIDAZOLAM HCL 2 MG/2ML IJ SOLN
1.0000 mg | INTRAMUSCULAR | Status: DC
  Filled 2022-01-16: qty 2

## 2022-01-16 MED ORDER — PROPYLENE GLYCOL 0.6 % OP SOLN: 1.0000 [drp] | Freq: Two times a day (BID) | OPHTHALMIC | Status: DC | PRN

## 2022-01-16 SURGICAL SUPPLY — 53 items
ADH SKN CLS APL DERMABOND .7 (GAUZE/BANDAGES/DRESSINGS) ×1
ATTUNE MED ANAT PAT 35 KNEE (Knees) ×1 IMPLANT
ATTUNE PSFEM RTSZ5 NARCEM KNEE (Femur) ×1 IMPLANT
ATTUNE PSRP INSR SZ5 6 KNEE (Insert) ×1 IMPLANT
BAG COUNTER SPONGE SURGICOUNT (BAG) IMPLANT
BAG SPEC THK2 15X12 ZIP CLS (MISCELLANEOUS)
BAG SPNG CNTER NS LX DISP (BAG)
BAG ZIPLOCK 12X15 (MISCELLANEOUS) IMPLANT
BASEPLATE TIBIAL ROTATING SZ 4 (Knees) ×1 IMPLANT
BLADE SAW SGTL 11.0X1.19X90.0M (BLADE) IMPLANT
BLADE SAW SGTL 13.0X1.19X90.0M (BLADE) ×2 IMPLANT
BLADE SURG SZ10 CARB STEEL (BLADE) ×4 IMPLANT
BNDG ELASTIC 6X5.8 VLCR STR LF (GAUZE/BANDAGES/DRESSINGS) ×2 IMPLANT
BOWL SMART MIX CTS (DISPOSABLE) ×2 IMPLANT
BSPLAT TIB 4 CMNT ROT PLAT STR (Knees) ×1 IMPLANT
CEMENT HV SMART SET (Cement) ×2 IMPLANT
CUFF TOURN SGL QUICK 34 (TOURNIQUET CUFF) ×2
CUFF TRNQT CYL 34X4.125X (TOURNIQUET CUFF) ×1 IMPLANT
DERMABOND ADVANCED (GAUZE/BANDAGES/DRESSINGS) ×1
DERMABOND ADVANCED .7 DNX12 (GAUZE/BANDAGES/DRESSINGS) ×1 IMPLANT
DRAPE INCISE IOBAN 66X45 STRL (DRAPES) ×2 IMPLANT
DRAPE U-SHAPE 47X51 STRL (DRAPES) ×2 IMPLANT
DRESSING AQUACEL AG SP 3.5X10 (GAUZE/BANDAGES/DRESSINGS) ×1 IMPLANT
DRSG AQUACEL AG SP 3.5X10 (GAUZE/BANDAGES/DRESSINGS) ×2
DURAPREP 26ML APPLICATOR (WOUND CARE) ×4 IMPLANT
ELECT REM PT RETURN 15FT ADLT (MISCELLANEOUS) ×2 IMPLANT
GLOVE SURG ENC MOIS LTX SZ6 (GLOVE) ×2 IMPLANT
GLOVE SURG ENC MOIS LTX SZ7 (GLOVE) ×2 IMPLANT
GLOVE SURG UNDER POLY LF SZ7.5 (GLOVE) ×2 IMPLANT
GOWN STRL REUS W/TWL LRG LVL3 (GOWN DISPOSABLE) ×2 IMPLANT
HANDPIECE INTERPULSE COAX TIP (DISPOSABLE) ×2
HOLDER FOLEY CATH W/STRAP (MISCELLANEOUS) IMPLANT
KIT TURNOVER KIT A (KITS) IMPLANT
MANIFOLD NEPTUNE II (INSTRUMENTS) ×2 IMPLANT
NDL SAFETY ECLIPSE 18X1.5 (NEEDLE) IMPLANT
NEEDLE HYPO 18GX1.5 SHARP (NEEDLE)
NS IRRIG 1000ML POUR BTL (IV SOLUTION) ×2 IMPLANT
PACK TOTAL KNEE CUSTOM (KITS) ×2 IMPLANT
PROTECTOR NERVE ULNAR (MISCELLANEOUS) ×2 IMPLANT
SET HNDPC FAN SPRY TIP SCT (DISPOSABLE) ×1 IMPLANT
SET PAD KNEE POSITIONER (MISCELLANEOUS) ×2 IMPLANT
SPIKE FLUID TRANSFER (MISCELLANEOUS) ×4 IMPLANT
SPONGE T-LAP 18X18 ~~LOC~~+RFID (SPONGE) ×6 IMPLANT
SUT MNCRL AB 4-0 PS2 18 (SUTURE) ×2 IMPLANT
SUT STRATAFIX PDS+ 0 24IN (SUTURE) ×2 IMPLANT
SUT VIC AB 1 CT1 36 (SUTURE) ×2 IMPLANT
SUT VIC AB 2-0 CT1 27 (SUTURE) ×6
SUT VIC AB 2-0 CT1 TAPERPNT 27 (SUTURE) ×3 IMPLANT
SYR 3ML LL SCALE MARK (SYRINGE) ×2 IMPLANT
TRAY FOLEY MTR SLVR 16FR STAT (SET/KITS/TRAYS/PACK) ×2 IMPLANT
TUBE SUCTION HIGH CAP CLEAR NV (SUCTIONS) ×2 IMPLANT
WATER STERILE IRR 1000ML POUR (IV SOLUTION) ×4 IMPLANT
WRAP KNEE MAXI GEL POST OP (GAUZE/BANDAGES/DRESSINGS) ×2 IMPLANT

## 2022-01-16 NOTE — H&P (Signed)
TOTAL KNEE ADMISSION H&P  Patient is being admitted for right total knee arthroplasty.  Subjective:  Chief Complaint:right knee pain.  HPI: Deborah Fowler, 71 y.o. female, has a history of pain and functional disability in the right knee due to arthritis and has failed non-surgical conservative treatments for greater than 12 weeks to includeNSAID's and/or analgesics, corticosteriod injections, and activity modification.  Onset of symptoms was gradual, starting 2 years ago with gradually worsening course since that time. The patient noted no past surgery on the right knee(s).  Patient currently rates pain in the right knee(s) at 7 out of 10 with activity. Patient has worsening of pain with activity and weight bearing, pain that interferes with activities of daily living, and pain with passive range of motion.  Patient has evidence of joint space narrowing by imaging studies. There is no active infection.  Patient Active Problem List   Diagnosis Date Noted   Vitamin D insufficiency 07/26/2019   Hypertriglyceridemia 07/26/2019   Prediabetes 05/28/2019   Bilateral hip bursitis 05/28/2019   Chronic constipation- yrs and yrs.  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 saw GI 05/28/2019   HLD (hyperlipidemia) 05/28/2019   Elevated blood pressure reading 05/28/2019   Primary open angle glaucoma of left eye, severe stage 04/17/2018   Past Medical History:  Diagnosis Date   Breast lump    Bursitis of hip    Cervical arthritis    Cobalamin deficiency    Depression    Mixed hyperlipidemia    PONV (postoperative nausea and vomiting)    Tinnitus     Past Surgical History:  Procedure Laterality Date   CARPAL TUNNEL RELEASE     PLANTAR FASCIA SURGERY     TUBAL LIGATION      Current Facility-Administered Medications  Medication Dose Route Frequency Provider  Last Rate Last Admin   betamethasone acetate-betamethasone sodium phosphate (CELESTONE) injection 3 mg  3 mg Intramuscular Once Edrick Kins, DPM       Current Outpatient Medications  Medication Sig Dispense Refill Last Dose   Acetylcysteine (N-ACETYL-L-CYSTEINE PO) Take 600 mg by mouth 2 (two) times a week.      ALPRAZolam (XANAX) 0.5 MG tablet Take 0.5 mg by mouth at bedtime.      Ascorbic Acid (VITAMIN C) 1000 MG tablet Take 1,000 mg by mouth 2 (two) times a week.      b complex vitamins capsule Take 1 capsule by mouth 2 (two) times a week.      Calcium Carbonate (CALCIUM 600 PO) Take 1,200 mg by mouth daily.      cetirizine (ZYRTEC) 10 MG tablet Take 10 mg by mouth daily.      Cholecalciferol (VITAMIN D3) 20 MCG (800 UNIT) TABS Take 800 Units by mouth daily.      cycloSPORINE (RESTASIS) 0.05 % ophthalmic emulsion Place 1 drop into both eyes 2 (two) times daily.      diclofenac (VOLTAREN) 75 MG EC tablet Take 75 mg by mouth 2 (two) times daily.      famotidine (PEPCID) 20 MG tablet Take 20 mg by mouth 2 (two) times daily.      Flaxseed, Linseed, (FLAXSEED OIL) 1400 MG CAPS Take 1,400 mg by mouth 2 (two) times a week.      ibandronate (BONIVA) 150 MG tablet Take 150 mg by mouth every 30 (thirty) days. Take in the morning with a  full glass of water, on an empty stomach, and do not take anything else by mouth or lie down for the next 30 min.      latanoprost (XALATAN) 0.005 % ophthalmic solution Place 1 drop into both eyes at bedtime.      Magnesium 250 MG TABS Take 250 mg by mouth 2 (two) times a week.      Omega-3 Fatty Acids (FISH OIL) 1000 MG CAPS Take 1,000 mg by mouth 2 (two) times a week.      polyethylene glycol (MIRALAX / GLYCOLAX) packet Take 8.5 g by mouth daily.      Propylene Glycol (SYSTANE BALANCE) 0.6 % SOLN Place 1 drop into both eyes 2 (two) times daily as needed (dry eyes).      QUERCETIN PO Take 1,000 mg by mouth 2 (two) times a week.      selenium 200 MCG TABS tablet  Take 200 mcg by mouth 2 (two) times a week.      sertraline (ZOLOFT) 100 MG tablet Take 1 tablet (100 mg total) by mouth daily. 90 tablet 0    terbinafine (LAMISIL) 250 MG tablet Take 1 tablet (250 mg total) by mouth daily. 90 tablet 0    timolol (TIMOPTIC) 0.5 % ophthalmic solution Place 1 drop into both eyes daily.      Turmeric 500 MG CAPS Take 500 mg by mouth daily.      vitamin B-12 (CYANOCOBALAMIN) 1000 MCG tablet Take 1,000 mcg by mouth 2 (two) times a week.      zinc gluconate 50 MG tablet Take 50 mg by mouth 2 (two) times a week.      No Known Allergies  Social History   Tobacco Use   Smoking status: Former    Packs/day: 1.00    Years: 4.00    Pack years: 4.00    Types: Cigarettes    Start date: 12/10/1974    Quit date: 12/10/1978    Years since quitting: 43.1   Smokeless tobacco: Never  Substance Use Topics   Alcohol use: Yes    Alcohol/week: 5.0 standard drinks    Types: 5 Standard drinks or equivalent per week    Comment: rarely    Family History  Problem Relation Age of Onset   Hyperlipidemia Mother    Alcohol abuse Father    Heart attack Brother    Colon cancer Maternal Grandmother      Review of Systems  Constitutional:  Negative for chills and fever.  Respiratory:  Negative for cough and shortness of breath.   Cardiovascular:  Negative for chest pain.  Gastrointestinal:  Negative for nausea and vomiting.  Musculoskeletal:  Positive for arthralgias.    Objective:  Physical Exam Well nourished and well developed. General: Alert and oriented x3, cooperative and pleasant, no acute distress. Head: normocephalic, atraumatic, neck supple. Eyes: EOMI.  Musculoskeletal: Right knee exam: No palpable effusion, warmth or erythema Tenderness over the medial and anterior aspect of the joint predominantly Stable medial lateral collateral ligaments Slight flexion contracture with flexion to 120 degrees with tightness and pain   Calves soft and nontender. Motor  function intact in LE. Strength 5/5 LE bilaterally. Neuro: Distal pulses 2+. Sensation to light touch intact in LE.  Vital signs in last 24 hours:    Labs:   Estimated body mass index is 34.54 kg/m as calculated from the following:   Height as of 01/03/22: 5\' 3"  (1.6 m).   Weight as of 01/03/22: 88.5 kg.  Imaging Review Plain radiographs demonstrate severe degenerative joint disease of the right knee(s). The overall alignment isneutral. The bone quality appears to be adequate for age and reported activity level.      Assessment/Plan:  End stage arthritis, right knee   The patient history, physical examination, clinical judgment of the provider and imaging studies are consistent with end stage degenerative joint disease of the right knee(s) and total knee arthroplasty is deemed medically necessary. The treatment options including medical management, injection therapy arthroscopy and arthroplasty were discussed at length. The risks and benefits of total knee arthroplasty were presented and reviewed. The risks due to aseptic loosening, infection, stiffness, patella tracking problems, thromboembolic complications and other imponderables were discussed. The patient acknowledged the explanation, agreed to proceed with the plan and consent was signed. Patient is being admitted for inpatient treatment for surgery, pain control, PT, OT, prophylactic antibiotics, VTE prophylaxis, progressive ambulation and ADL's and discharge planning. The patient is planning to be discharged  home.   Therapy Plans: outpatient therapy at [facility] Disposition: Home with husband Planned DVT Prophylaxis: aspirin 81mg  BID DME needed: walker PCP: Dr. Cecille Amsterdam, clearance received TXA: IV Allergies: IV Anesthesia Concerns: none BMI: 35.7 Last HgbA1c: Not diabetic  Other: - Oxycodone, robaxin, tylenol, celebrex - Staying overnight  Patient's anticipated LOS is less than 2 midnights, meeting these  requirements: - Younger than 31 - Lives within 1 hour of care - Has a competent adult at home to recover with post-op recover - NO history of  - Chronic pain requiring opiods  - Diabetes  - Coronary Artery Disease  - Heart failure  - Heart attack  - Stroke  - DVT/VTE  - Cardiac arrhythmia  - Respiratory Failure/COPD  - Renal failure  - Anemia  - Advanced Liver disease  Costella Hatcher, PA-C Orthopedic Surgery [facility] [place] [phone]

## 2022-01-16 NOTE — Anesthesia Postprocedure Evaluation (Signed)
Anesthesia Post Note  Patient: Deborah Fowler  Procedure(s) Performed: TOTAL KNEE ARTHROPLASTY (Right: Knee)     Patient location during evaluation: PACU Anesthesia Type: Spinal Level of consciousness: awake and alert Pain management: pain level controlled Vital Signs Assessment: post-procedure vital signs reviewed and stable Respiratory status: spontaneous breathing Cardiovascular status: stable Anesthetic complications: no   No notable events documented.  Last Vitals:  Vitals:   01/16/22 1815 01/16/22 1850  BP: (!) 117/54 113/68  Pulse: 65 63  Resp: 18   Temp:  36.6 C  SpO2: 92% 93%    Last Pain:  Vitals:   01/16/22 1850  TempSrc: Oral  PainSc:                  Nolon Nations

## 2022-01-16 NOTE — Discharge Instructions (Signed)

## 2022-01-16 NOTE — Transfer of Care (Signed)
Immediate Anesthesia Transfer of Care Note  Patient: Deborah Fowler  Procedure(s) Performed: TOTAL KNEE ARTHROPLASTY (Right: Knee)  Patient Location: PACU  Anesthesia Type:Spinal  Level of Consciousness: drowsy  Airway & Oxygen Therapy: Patient Spontanous Breathing and Patient connected to face mask oxygen  Post-op Assessment: Report given to RN  Post vital signs: Reviewed and stable  Last Vitals:  Vitals Value Taken Time  BP 122/72 01/16/22 1721  Temp    Pulse 64 01/16/22 1723  Resp 21 01/16/22 1723  SpO2 100 % 01/16/22 1723  Vitals shown include unvalidated device data.  Last Pain:  Vitals:   01/16/22 1336  TempSrc:   PainSc: 0-No pain      Patients Stated Pain Goal: 4 (98/92/11 9417)  Complications: No notable events documented.

## 2022-01-16 NOTE — Anesthesia Procedure Notes (Signed)
Spinal  Patient location during procedure: OR Start time: 01/16/2022 3:34 PM End time: 01/16/2022 3:39 PM Reason for block: surgical anesthesia Staffing Performed: anesthesiologist  Anesthesiologist: Nolon Nations, MD Preanesthetic Checklist Completed: patient identified, IV checked, site marked, risks and benefits discussed, surgical consent, monitors and equipment checked, pre-op evaluation and timeout performed Spinal Block Patient position: sitting Prep: DuraPrep and site prepped and draped Patient monitoring: heart rate, blood pressure and continuous pulse ox Approach: right paramedian Location: L3-4 Injection technique: single-shot Needle Needle type: Spinocan  Needle gauge: 25 G Needle length: 9 cm Additional Notes Expiration date of kit checked and confirmed. Patient tolerated procedure well, without complications.

## 2022-01-16 NOTE — Anesthesia Preprocedure Evaluation (Addendum)
Anesthesia Evaluation  Patient identified by MRN, date of birth, ID band Patient awake    Reviewed: Allergy & Precautions, NPO status , Patient's Chart, lab work & pertinent test results  History of Anesthesia Complications (+) PONV and history of anesthetic complications  Airway Mallampati: II  TM Distance: >3 FB Neck ROM: Full    Dental  (+) Dental Advisory Given, Teeth Intact, Caps   Pulmonary neg pulmonary ROS, former smoker,    Pulmonary exam normal breath sounds clear to auscultation       Cardiovascular negative cardio ROS Normal cardiovascular exam Rhythm:Regular Rate:Normal     Neuro/Psych PSYCHIATRIC DISORDERS Anxiety Depression negative neurological ROS     GI/Hepatic negative GI ROS, Neg liver ROS,   Endo/Other  negative endocrine ROS  Renal/GU negative Renal ROS     Musculoskeletal  (+) Arthritis ,   Abdominal (+) + obese,   Peds  Hematology negative hematology ROS (+)   Anesthesia Other Findings   Reproductive/Obstetrics                            Anesthesia Physical Anesthesia Plan  ASA: 2  Anesthesia Plan: Spinal   Post-op Pain Management: Tylenol PO (pre-op) and Regional block   Induction: Intravenous  PONV Risk Score and Plan: 4 or greater and Treatment may vary due to age or medical condition, Ondansetron, Dexamethasone, Propofol infusion and TIVA  Airway Management Planned: Natural Airway  Additional Equipment:   Intra-op Plan:   Post-operative Plan:   Informed Consent: I have reviewed the patients History and Physical, chart, labs and discussed the procedure including the risks, benefits and alternatives for the proposed anesthesia with the patient or authorized representative who has indicated his/her understanding and acceptance.     Dental advisory given  Plan Discussed with: CRNA  Anesthesia Plan Comments:        Anesthesia Quick  Evaluation

## 2022-01-16 NOTE — Op Note (Signed)
NAME:  Deborah Fowler                      MEDICAL RECORD NO.:  [id number]                             FACILITY:  [facility]      PHYSICIAN:  Pietro Cassis. Alvan Dame, M.D.  DATE OF BIRTH:  04/20/1951      DATE OF PROCEDURE:  01/16/2022                                     OPERATIVE REPORT         PREOPERATIVE DIAGNOSIS:  Right knee osteoarthritis.      POSTOPERATIVE DIAGNOSIS:  Right knee osteoarthritis.      FINDINGS:  The patient was noted to have complete loss of cartilage and   bone-on-bone arthritis with associated osteophytes in the medial and patellofemoral compartments of   the knee with a significant synovitis and associated effusion.  The patient had failed months of conservative treatment including medications, injection therapy, activity modification.     PROCEDURE:  Right total knee replacement.      COMPONENTS USED:  DePuy Attune rotating platform posterior stabilized knee   system, a size 5N femur, 4 tibia, size 6 mm PS AOX insert, and 35 anatomic patellar   button.      SURGEON:  Pietro Cassis. Alvan Dame, M.D.      ASSISTANT:  Costella Hatcher, PA-C.      ANESTHESIA:  Regional and Spinal.      SPECIMENS:  None.      COMPLICATION:  None.      DRAINS:  None.  EBL: <100 cc      TOURNIQUET TIME:  29 min at 225 mmHg     The patient was stable to the recovery room.      INDICATION FOR PROCEDURE:  Deborah Fowler is a 71 y.o. female patient of   mine.  The patient had been seen, evaluated, and treated for months conservatively in the   office with medication, activity modification, and injections.  The patient had   radiographic changes of bone-on-bone arthritis with endplate sclerosis and osteophytes noted.  Based on the radiographic changes and failed conservative measures, the patient   decided to proceed with definitive treatment, total knee replacement.  Risks of infection, DVT, component failure, need for revision surgery, neurovascular injury were reviewed in the office setting.   The postop course was reviewed stressing the efforts to maximize post-operative satisfaction and function.  Consent was obtained for benefit of pain   relief.      PROCEDURE IN DETAIL:  The patient was brought to the operative theater.   Once adequate anesthesia, preoperative antibiotics, 2 gm of Ancef,1 gm of Tranexamic Acid, and 10 mg of Decadron administered, the patient was positioned supine with a right thigh tourniquet placed.  The  right lower extremity was prepped and draped in sterile fashion.  A time-   out was performed identifying the patient, planned procedure, and the appropriate extremity.      The right lower extremity was placed in the [facility] leg holder.  The leg was   exsanguinated, tourniquet elevated to 250 mmHg.  A midline incision was   made followed by median parapatellar arthrotomy.  Following initial   exposure, attention was first directed  to the patella.  Precut   measurement was noted to be 22-23 mm.  I resected down to 14 mm and used a   35 anatomic patellar button to restore patellar height as well as cover the cut surface.      The lug holes were drilled and a metal shim was placed to protect the   patella from retractors and saw blade during the procedure.      At this point, attention was now directed to the femur.  The femoral   canal was opened with a drill, irrigated to try to prevent fat emboli.  An   intramedullary rod was passed at 3 degrees valgus, 9 mm of bone was   resected off the distal femur.  Following this resection, the tibia was   subluxated anteriorly.  Using the extramedullary guide, 2 mm of bone was resected off   the proximal medial tibia.  We confirmed the gap would be   stable medially and laterally with a size 5 spacer block as well as confirmed that the tibial cut was perpendicular in the coronal plane, checking with an alignment rod.      Once this was done, I sized the femur to be a size 5 in the anterior-   posterior dimension,  chose a narrow component based on medial and   lateral dimension.  The size 5 rotation block was then pinned in   position anterior referenced using the C-clamp to set rotation.  The   anterior, posterior, and  chamfer cuts were made without difficulty nor   notching making certain that I was along the anterior cortex to help   with flexion gap stability.      The final box cut was made off the lateral aspect of distal femur.      At this point, the tibia was sized to be a size 4.  The size 4 tray was   then pinned in position through the medial third of the tubercle,   drilled, and keel punched.  Trial reduction was now carried with a 5 femur,  4 tibia, a size 6 mm PS insert, and the 35 anatomic patella botton.  The knee was brought to full extension with good flexion stability with the patella   tracking through the trochlea without application of pressure.  Given   all these findings the trial components removed.  Final components were   opened and cement was mixed.  The knee was irrigated with normal saline solution and pulse lavage.  The synovial lining was   then injected with 30 cc of 0.25% Marcaine with epinephrine, 1 cc of Toradol and 30 cc of NS for a total of 61 cc.     Final implants were then cemented onto cleaned and dried cut surfaces of bone with the knee brought to extension with a size 6 mm PS trial insert.      Once the cement had fully cured, excess cement was removed   throughout the knee.  I confirmed that I was satisfied with the range of   motion and stability, and the final size 6 mm PS AOX insert was chosen.  It was   placed into the knee.      The tourniquet had been let down at 29 minutes.  No significant   hemostasis was required.  The extensor mechanism was then reapproximated using #1 Vicryl and #1 Stratafix sutures with the knee   in flexion.  The   remaining  wound was closed with 2-0 Vicryl and running 4-0 Monocryl.   The knee was cleaned, dried, dressed  sterilely using Dermabond and   Aquacel dressing.  The patient was then   brought to recovery room in stable condition, tolerating the procedure   well.   Please note that Physician Assistant, Costella Hatcher, PA-C was present for the entirety of the case, and was utilized for pre-operative positioning, peri-operative retractor management, general facilitation of the procedure and for primary wound closure at the end of the case.              Pietro Cassis Alvan Dame, M.D.    01/16/2022 3:54 PM

## 2022-01-16 NOTE — Interval H&P Note (Signed)
History and Physical Interval Note:  01/16/2022 2:15 PM  Deborah Fowler  has presented today for surgery, with the diagnosis of Right knee osteoarthritis.  The various methods of treatment have been discussed with the patient and family. After consideration of risks, benefits and other options for treatment, the patient has consented to  Procedure(s): TOTAL KNEE ARTHROPLASTY (Right) as a surgical intervention.  The patient's history has been reviewed, patient examined, no change in status, stable for surgery.  I have reviewed the patient's chart and labs.  Questions were answered to the patient's satisfaction.     [name]

## 2022-01-16 NOTE — Progress Notes (Signed)
Assisted Dr. Germeroth with right, ultrasound guided, adductor canal block. Side rails up, monitors on throughout procedure. See vital signs in flow sheet. Tolerated Procedure well. 

## 2022-01-16 NOTE — Anesthesia Procedure Notes (Signed)
Anesthesia Regional Block: Adductor canal block   Pre-Anesthetic Checklist: , timeout performed,  Correct Patient, Correct Site, Correct Laterality,  Correct Procedure, Correct Position, site marked,  Risks and benefits discussed,  Surgical consent,  Pre-op evaluation,  At surgeon's request and post-op pain management  Laterality: Lower and Right  Prep: chloraprep       Needles:  Injection technique: Single-shot  Needle Type: Stimiplex     Needle Length: 9cm  Needle Gauge: 21     Additional Needles:   Procedures:,,,, ultrasound used (permanent image in chart),,    Narrative:  Start time: 01/16/2022 2:04 PM End time: 01/16/2022 2:24 PM Injection made incrementally with aspirations every 5 mL.  Performed by: Personally  Anesthesiologist: Nolon Nations, MD  Additional Notes: BP cuff, EKG monitors applied. Sedation begun. Artery and nerve location verified with ultrasound. Anesthetic injected incrementally (67ml), slowly, and after negative aspirations under direct u/s guidance. Good fascial/perineural spread. Tolerated well.

## 2022-01-17 ENCOUNTER — Encounter (HOSPITAL_COMMUNITY): Payer: Self-pay | Admitting: Orthopedic Surgery

## 2022-01-17 DIAGNOSIS — M1711 Unilateral primary osteoarthritis, right knee: Secondary | ICD-10-CM | POA: Diagnosis not present

## 2022-01-17 LAB — BASIC METABOLIC PANEL
Anion gap: 8 (ref 5–15)
BUN: 15 mg/dL (ref 8–23)
CO2: 23 mmol/L (ref 22–32)
Calcium: 8.2 mg/dL — ABNORMAL LOW (ref 8.9–10.3)
Chloride: 106 mmol/L (ref 98–111)
Creatinine, Ser: 0.69 mg/dL (ref 0.44–1.00)
GFR, Estimated: >60 mL/min (ref >60)
Glucose, Bld: 160 mg/dL — ABNORMAL HIGH (ref 70–99)
Potassium: 4.1 mmol/L (ref 3.5–5.1)
Sodium: 137 mmol/L (ref 135–145)

## 2022-01-17 LAB — CBC
HCT: 37.5 % (ref 36.0–46.0)
Hemoglobin: 11.9 g/dL — ABNORMAL LOW (ref 12.0–15.0)
MCH: 29.5 pg (ref 26.0–34.0)
MCHC: 31.7 g/dL (ref 30.0–36.0)
MCV: 93.1 fL (ref 80.0–100.0)
Platelets: 238 10*3/uL (ref 150–400)
RBC: 4.03 MIL/uL (ref 3.87–5.11)
RDW: 13.9 % (ref 11.5–15.5)
WBC: 9.9 10*3/uL (ref 4.0–10.5)
nRBC: 0 % (ref 0.0–0.2)

## 2022-01-17 MED ORDER — DOCUSATE SODIUM 100 MG PO CAPS: 100.0000 mg | ORAL_CAPSULE | Freq: Two times a day (BID) | ORAL | 0 refills | Status: DC

## 2022-01-17 MED ORDER — ACETAMINOPHEN 325 MG PO TABS: 1000.0000 mg | ORAL_TABLET | Freq: Four times a day (QID) | ORAL | Status: DC

## 2022-01-17 MED ORDER — ASPIRIN 81 MG PO CHEW: 81.0000 mg | CHEWABLE_TABLET | Freq: Two times a day (BID) | ORAL | 0 refills | Status: AC

## 2022-01-17 MED ORDER — CELECOXIB 200 MG PO CAPS: 200.0000 mg | ORAL_CAPSULE | Freq: Two times a day (BID) | ORAL | 0 refills | Status: AC

## 2022-01-17 MED ORDER — OXYCODONE HCL 5 MG PO TABS: 5.0000 mg | ORAL_TABLET | ORAL | 0 refills | Status: DC | PRN

## 2022-01-17 MED ORDER — METHOCARBAMOL 500 MG PO TABS: 500.0000 mg | ORAL_TABLET | Freq: Four times a day (QID) | ORAL | 0 refills | Status: DC | PRN

## 2022-01-17 MED ORDER — SODIUM CHLORIDE 0.9 % IV BOLUS: 250.0000 mL | Freq: Once | INTRAVENOUS | Status: DC

## 2022-01-17 NOTE — TOC Transition Note (Signed)
Transition of Care [facility]) - CM/SW Discharge Note  Patient Details  Name: Deborah Fowler MRN: [id number] Date of Birth: 07-May-1951  [facility] CM/SW Contact:  Sherie Don, LCSW Phone Number: 01/17/2022, 10:49 AM  Clinical Narrative: Patient is expected to discharge home after working with PT. CSW met with patient to review discharge plan and needs. Patient will discharge home with OPPT at [facility]. Patient will need a rolling walker, so [employer] will deliver walker to patient's room. TOC signing off.  Final next level of care: OP Rehab Barriers to Discharge: No Barriers Identified  Patient Goals and CMS Choice Patient states their goals for this hospitalization and ongoing recovery are:: Discharge home with OPPT at [facility] CMS Medicare.gov Compare Post Acute Care list provided to:: Patient Choice offered to / list presented to : Patient  Discharge Plan and Services         DME Arranged: Walker rolling DME Agency: [employer] Date DME Agency Contacted: 01/17/22 Representative spoke with at DME Agency: Wells Guiles  Readmission Risk Interventions No flowsheet data found.

## 2022-01-17 NOTE — Progress Notes (Signed)
Reviewed written d/c instructions w pt and all questions answered. She verbalized understanding. D/C per w/c w all belongings in stable condition. 

## 2022-01-17 NOTE — Progress Notes (Signed)
° °  Subjective: 1 Day Post-Op Procedure(s) (LRB): TOTAL KNEE ARTHROPLASTY (Right) Patient reports pain as mild.   Patient seen in rounds for Dr. Alvan Dame. Patient is well, and has had no acute complaints or problems. No acute events overnight. Foley catheter removed. Patient has not been up with PT yet. She reports she is comfortable at rest, but has pain when moving the knee.  We will start therapy today.   Objective: Vital signs in last 24 hours: Temp:  [97.7 F (36.5 C)-98.1 F (36.7 C)] 98.1 F (36.7 C) (02/08 0931) Pulse Rate:  [62-81] 71 (02/08 0931) Resp:  [12-20] 16 (02/08 0931) BP: (92-141)/(49-86) 102/49 (02/08 0931) SpO2:  [90 %-98 %] 94 % (02/08 0931) Weight:  [88.5 kg] 88.5 kg (02/07 1336)  Intake/Output from previous day:  Intake/Output Summary (Last 24 hours) at 01/17/2022 0946 Last data filed at 01/17/2022 0600 Gross per 24 hour  Intake 1800 ml  Output 2250 ml  Net -450 ml     Intake/Output this shift: No intake/output data recorded.  Labs: Recent Labs    01/17/22 0427  HGB 11.9*   Recent Labs    01/17/22 0427  WBC 9.9  RBC 4.03  HCT 37.5  PLT 238   Recent Labs    01/17/22 0427  NA 137  K 4.1  CL 106  CO2 23  BUN 15  CREATININE 0.69  GLUCOSE 160*  CALCIUM 8.2*   No results for input(s): LABPT, INR in the last 72 hours.  Exam: General - Patient is Alert and Oriented Extremity - Neurologically intact Sensation intact distally Intact pulses distally Dorsiflexion/Plantar flexion intact Dressing - dressing C/D/I Motor Function - intact, moving foot and toes well on exam.   Past Medical History:  Diagnosis Date   Breast lump    Bursitis of hip    Cervical arthritis    Cobalamin deficiency    Depression    Mixed hyperlipidemia    PONV (postoperative nausea and vomiting)    Tinnitus     Assessment/Plan: 1 Day Post-Op Procedure(s) (LRB): TOTAL KNEE ARTHROPLASTY (Right) Principal Problem:   S/P total knee arthroplasty,  right  Estimated body mass index is 34.54 kg/m as calculated from the following:   Height as of this encounter: 5\' 3"  (1.6 m).   Weight as of this encounter: 88.5 kg. Advance diet Up with therapy D/C IV fluids   Patient's anticipated LOS is less than 2 midnights, meeting these requirements: - Younger than 45 - Lives within 1 hour of care - Has a competent adult at home to recover with post-op recover - NO history of  - Chronic pain requiring opiods  - Diabetes  - Coronary Artery Disease  - Heart failure  - Heart attack  - Stroke  - DVT/VTE  - Cardiac arrhythmia  - Respiratory Failure/COPD  - Renal failure  - Anemia  - Advanced Liver disease     DVT Prophylaxis - Aspirin Weight bearing as tolerated.  Hgb stable at 11.9 this AM.  BP running a bit soft, will give fluid bolus today.   Plan is to go Home after hospital stay. Plan for discharge today following 1-2 sessions of PT as long as they are meeting their goals. Patient is scheduled for OPPT. Follow up in the office in 2 weeks.   Griffith Citron, PA-C Orthopedic Surgery [phone] 01/17/2022, 9:46 AM

## 2022-01-17 NOTE — Evaluation (Signed)
Physical Therapy Evaluation Patient Details Name: Deborah Fowler MRN: [id number] DOB: Oct 19, 1951 Today's Date: 01/17/2022  History of Present Illness  71 y.o. female admitted 01/16/22 for R TKA. PMH includes: depression, B hip bursitis, carpal tunnel release, plantar fascia surgery.  Clinical Impression  Pt is s/p TKA resulting in the deficits listed below (see PT Problem List). Pt ambulated 38' with RW, distance limited by nausea. Vital signs stable (see flowsheets). Initiated TKA HEP instruction. Will plan on second session this afternoon for stair training and to progress mobility/HEP. I expect she'll be ready to DC home this afternoon.  Pt will benefit from skilled PT to increase their independence and safety with mobility to allow discharge to the venue listed below.         Recommendations for follow up therapy are one component of a multi-disciplinary discharge planning process, led by the attending physician.  Recommendations may be updated based on patient status, additional functional criteria and insurance authorization.  Follow Up Recommendations Outpatient PT    Assistance Recommended at Discharge Intermittent Supervision/Assistance  Patient can return home with the following  Assistance with cooking/housework;Assist for transportation;Help with stairs or ramp for entrance;A little help with bathing/dressing/bathroom    Equipment Recommendations Rolling walker (2 wheels)  Recommendations for Other Services       Functional Status Assessment Patient has had a recent decline in their functional status and demonstrates the ability to make significant improvements in function in a reasonable and predictable amount of time.     Precautions / Restrictions Precautions Precautions: Knee Precaution Booklet Issued: Yes (comment) Precaution Comments: reviewed no pillow under knee Restrictions RLE Weight Bearing: Weight bearing as tolerated      Mobility  Bed Mobility Overal bed  mobility: Modified Independent             General bed mobility comments: HOB up    Transfers Overall transfer level: Needs assistance Equipment used: Rolling walker (2 wheels) Transfers: Sit to/from Stand Sit to Stand: Min guard           General transfer comment: VCs hand placement    Ambulation/Gait Ambulation/Gait assistance: Min guard Gait Distance (Feet): 45 Feet Assistive device: Rolling walker (2 wheels) Gait Pattern/deviations: Step-to pattern, Decreased step length - right, Decreased step length - left Gait velocity: decr     General Gait Details: VCs sequencing, no loss of balance, distance limited by nausea, assisted to recliner where vital signs were stable (BP 114/52 sitting, HR 63, SaO2 97% room air)  Stairs            Wheelchair Mobility    Modified Rankin (Stroke Patients Only)       Balance Overall balance assessment: Modified Independent                                           Pertinent Vitals/Pain Pain Assessment Pain Assessment: 0-10 Pain Score: 6  Pain Location: R knee Pain Descriptors / Indicators: Sore Pain Intervention(s): Limited activity within patient's tolerance, Monitored during session, Premedicated before session, Ice applied    Home Living Family/patient expects to be discharged to:: Private residence Living Arrangements: Spouse/significant other Available Help at Discharge: Family;Available 24 hours/day Type of Home: House Home Access: Stairs to enter Entrance Stairs-Rails: Left Entrance Stairs-Number of Steps: 3   Home Layout: One level Home Equipment: None      Prior Function Prior Level  of Function : Independent/Modified Independent                     Hand Dominance        Extremity/Trunk Assessment   Upper Extremity Assessment Upper Extremity Assessment: Overall WFL for tasks assessed    Lower Extremity Assessment Lower Extremity Assessment: RLE  deficits/detail RLE Deficits / Details: SLR 3/5, knee AAROM 0-40*    Cervical / Trunk Assessment Cervical / Trunk Assessment: Normal  Communication   Communication: No difficulties  Cognition Arousal/Alertness: Awake/alert Behavior During Therapy: WFL for tasks assessed/performed Overall Cognitive Status: Within Functional Limits for tasks assessed                                          General Comments      Exercises Total Joint Exercises Ankle Circles/Pumps: AROM, Both, 10 reps, Supine Quad Sets: AROM, Both, 5 reps, Supine Short Arc Quad: AROM, Right, 5 reps, Supine Heel Slides: AAROM, Right, 5 reps, Supine Hip ABduction/ADduction: AAROM, Right, 5 reps, Supine Straight Leg Raises: AAROM, Right, 5 reps, Supine, AROM Goniometric ROM: 0-40* AAROM R knee   Assessment/Plan    PT Assessment Patient needs continued PT services  PT Problem List Decreased strength;Decreased mobility;Decreased activity tolerance;Decreased range of motion;Pain       PT Treatment Interventions DME instruction;Therapeutic exercise;Gait training;Stair training;Patient/family education    PT Goals (Current goals can be found in the Care Plan section)  Acute Rehab PT Goals Patient Stated Goal: to walk for exercise PT Goal Formulation: With patient Time For Goal Achievement: 01/24/22 Potential to Achieve Goals: Good    Frequency 7X/week     Co-evaluation               AM-PAC PT "6 Clicks" Mobility  Outcome Measure Help needed turning from your back to your side while in a flat bed without using bedrails?: None Help needed moving from lying on your back to sitting on the side of a flat bed without using bedrails?: A Little Help needed moving to and from a bed to a chair (including a wheelchair)?: A Little Help needed standing up from a chair using your arms (e.g., wheelchair or bedside chair)?: A Little Help needed to walk in hospital room?: A Little Help needed  climbing 3-5 steps with a railing? : A Little 6 Click Score: 19    End of Session Equipment Utilized During Treatment: Gait belt Activity Tolerance: Patient tolerated treatment well Patient left: in chair;with call bell/phone within reach;with family/visitor present Nurse Communication: Mobility status PT Visit Diagnosis: Difficulty in walking, not elsewhere classified (R26.2);Pain Pain - Right/Left: Right Pain - part of body: Knee    Time: 0929-1005 PT Time Calculation (min) (ACUTE ONLY): 36 min   Charges:   PT Evaluation $PT Eval Moderate Complexity: 1 Mod PT Treatments $Gait Training: 8-22 mins       Blondell Reveal Kistler PT 01/17/2022  Acute Rehabilitation Services Pager [phone] Office [phone]

## 2022-01-17 NOTE — Progress Notes (Signed)
Physical Therapy Treatment Patient Details Name: Deborah Fowler MRN: [id number] DOB: [birth year] Today's Date: 01/17/2022   History of Present Illness 71 y.o. female admitted 01/16/22 for R TKA. PMH includes: depression, B hip bursitis, carpal tunnel release, plantar fascia surgery.    PT Comments    Pt has met PT goals and is ready to DC home from a PT standpoint. She completed stair training, demonstrates good understanding of HEP, and ambulated 130' with RW.    Recommendations for follow up therapy are one component of a multi-disciplinary discharge planning process, led by the attending physician.  Recommendations may be updated based on patient status, additional functional criteria and insurance authorization.  Follow Up Recommendations  Outpatient PT     Assistance Recommended at Discharge Intermittent Supervision/Assistance  Patient can return home with the following Assistance with cooking/housework;Assist for transportation;Help with stairs or ramp for entrance;A little help with bathing/dressing/bathroom   Equipment Recommendations  Rolling walker (2 wheels)    Recommendations for Other Services       Precautions / Restrictions Precautions Precautions: Knee Precaution Booklet Issued: Yes (comment) Precaution Comments: reviewed no pillow under knee Restrictions Weight Bearing Restrictions: No RLE Weight Bearing: Weight bearing as tolerated     Mobility  Bed Mobility Overal bed mobility: Modified Independent             General bed mobility comments: HOB up    Transfers Overall transfer level: Needs assistance Equipment used: Rolling walker (2 wheels) Transfers: Sit to/from Stand Sit to Stand: Supervision           General transfer comment: VCs hand placement    Ambulation/Gait Ambulation/Gait assistance: Supervision Gait Distance (Feet): 130 Feet Assistive device: Rolling walker (2 wheels) Gait Pattern/deviations: Step-to pattern, Decreased step  length - right, Decreased step length - left Gait velocity: decr     General Gait Details: VCs sequencing, no loss of balance   Stairs Stairs: Yes Stairs assistance: Min guard Stair Management: One rail Left, Step to pattern, Forwards Number of Stairs: 3 General stair comments: VCs sequencing   Wheelchair Mobility    Modified Rankin (Stroke Patients Only)       Balance Overall balance assessment: Modified Independent                                          Cognition Arousal/Alertness: Awake/alert Behavior During Therapy: WFL for tasks assessed/performed Overall Cognitive Status: Within Functional Limits for tasks assessed                                          Exercises Total Joint Exercises Ankle Circles/Pumps: AROM, Both, 10 reps, Supine Quad Sets: AROM, Both, 5 reps, Supine Short Arc Quad: AROM, Right, 5 reps, Supine Heel Slides: AAROM, Right, 5 reps, Supine Hip ABduction/ADduction: AAROM, Right, 5 reps, Supine Straight Leg Raises: AAROM, Right, 5 reps, Supine, AROM Long Arc Quad: AROM, Right, 5 reps, Seated Knee Flexion: AAROM, Right, 10 reps, Seated Goniometric ROM: 0-40* AAROM R knee    General Comments        Pertinent Vitals/Pain Pain Assessment Pain Assessment: 0-10 Pain Score: 4  Pain Location: R knee Pain Descriptors / Indicators: Sore Pain Intervention(s): Limited activity within patient's tolerance, Monitored during session, Ice applied, Repositioned    Home Living  Prior Function            PT Goals (current goals can now be found in the care plan section) Acute Rehab PT Goals Patient Stated Goal: to walk for exercise, get stronger at the gym PT Goal Formulation: With patient/family Time For Goal Achievement: 01/24/22 Potential to Achieve Goals: Good Progress towards PT goals: Progressing toward goals    Frequency    7X/week      PT Plan Current plan  remains appropriate    Co-evaluation              AM-PAC PT "6 Clicks" Mobility   Outcome Measure  Help needed turning from your back to your side while in a flat bed without using bedrails?: None Help needed moving from lying on your back to sitting on the side of a flat bed without using bedrails?: A Little Help needed moving to and from a bed to a chair (including a wheelchair)?: None Help needed standing up from a chair using your arms (e.g., wheelchair or bedside chair)?: None Help needed to walk in hospital room?: None Help needed climbing 3-5 steps with a railing? : A Little 6 Click Score: 22    End of Session Equipment Utilized During Treatment: Gait belt Activity Tolerance: Patient tolerated treatment well Patient left: in chair;with call bell/phone within reach;with family/visitor present Nurse Communication: Mobility status PT Visit Diagnosis: Difficulty in walking, not elsewhere classified (R26.2);Pain Pain - Right/Left: Right Pain - part of body: Knee     Time: 3976-7341 PT Time Calculation (min) (ACUTE ONLY): 27 min  Charges:  $Gait Training: 8-22 mins $Therapeutic Exercise: 8-22 mins                     Philomena Doheny PT 01/17/2022  Acute Rehabilitation Services Pager [phone] Office [phone]

## 2022-01-25 NOTE — Discharge Summary (Signed)
Patient ID: Deborah Fowler MRN: [id number] DOB/AGE: 1951/02/21 71 y.o.  Admit date: 01/16/2022 Discharge date: 01/17/2022  Admission Diagnoses:  Right knee osteoarthritis  Discharge Diagnoses:  Principal Problem:   S/P total knee arthroplasty, right   Past Medical History:  Diagnosis Date   Breast lump    Bursitis of hip    Cervical arthritis    Cobalamin deficiency    Depression    Mixed hyperlipidemia    PONV (postoperative nausea and vomiting)    Tinnitus     Surgeries: Procedure(s): TOTAL KNEE ARTHROPLASTY on 01/16/2022   Consultants:   Discharged Condition: Improved  Hospital Course: Deborah Fowler is an 71 y.o. female who was admitted 01/16/2022 for operative treatment ofS/P total knee arthroplasty, right. Patient has severe unremitting pain that affects sleep, daily activities, and work/hobbies. After pre-op clearance the patient was taken to the operating room on 01/16/2022 and underwent  Procedure(s): TOTAL KNEE ARTHROPLASTY.    Patient was given perioperative antibiotics:  Anti-infectives (From admission, onward)    Start     Dose/Rate Route Frequency Ordered Stop   01/16/22 2200  ceFAZolin (ANCEF) IVPB 2g/100 mL premix        2 g 200 mL/hr over 30 Minutes Intravenous Every 6 hours 01/16/22 1853 01/17/22 0427   01/16/22 1315  ceFAZolin (ANCEF) IVPB 2g/100 mL premix        2 g 200 mL/hr over 30 Minutes Intravenous On call to O.R. 01/16/22 1305 01/16/22 1540        Patient was given sequential compression devices, early ambulation, and chemoprophylaxis to prevent DVT. Patient worked with PT and was meeting their goals regarding safe ambulation and transfers.  Patient benefited maximally from hospital stay and there were no complications.    Recent vital signs: No data found.   Recent laboratory studies: No results for input(s): WBC, HGB, HCT, PLT, NA, K, CL, CO2, BUN, CREATININE, GLUCOSE, INR, CALCIUM in the last 72 hours.  Invalid input(s): PT,  2   Discharge Medications:   Allergies as of 01/17/2022   No Known Allergies      Medication List     STOP taking these medications    diclofenac 75 MG EC tablet Commonly known as: VOLTAREN       TAKE these medications    acetaminophen 325 MG tablet Commonly known as: TYLENOL Take 3 tablets (975 mg total) by mouth every 6 (six) hours.   ALPRAZolam 0.5 MG tablet Commonly known as: XANAX Take 0.5 mg by mouth at bedtime.   aspirin 81 MG chewable tablet Chew 1 tablet (81 mg total) by mouth 2 (two) times daily for 28 days.   b complex vitamins capsule Take 1 capsule by mouth 2 (two) times a week.   CALCIUM 600 PO Take 1,200 mg by mouth daily.   celecoxib 200 MG capsule Commonly known as: CELEBREX Take 1 capsule (200 mg total) by mouth 2 (two) times daily.   cetirizine 10 MG tablet Commonly known as: ZYRTEC Take 10 mg by mouth daily.   cycloSPORINE 0.05 % ophthalmic emulsion Commonly known as: RESTASIS Place 1 drop into both eyes 2 (two) times daily.   docusate sodium 100 MG capsule Commonly known as: COLACE Take 1 capsule (100 mg total) by mouth 2 (two) times daily.   famotidine 20 MG tablet Commonly known as: PEPCID Take 20 mg by mouth 2 (two) times daily.   Fish Oil 1000 MG Caps Take 1,000 mg by mouth 2 (two) times a week.  Flaxseed Oil 1400 MG Caps Take 1,400 mg by mouth 2 (two) times a week.   ibandronate 150 MG tablet Commonly known as: BONIVA Take 150 mg by mouth every 30 (thirty) days. Take in the morning with a full glass of water, on an empty stomach, and do not take anything else by mouth or lie down for the next 30 min.   latanoprost 0.005 % ophthalmic solution Commonly known as: XALATAN Place 1 drop into both eyes at bedtime.   Magnesium 250 MG Tabs Take 250 mg by mouth 2 (two) times a week.   methocarbamol 500 MG tablet Commonly known as: ROBAXIN Take 1 tablet (500 mg total) by mouth every 6 (six) hours as needed for muscle  spasms.   N-ACETYL-L-CYSTEINE PO Take 600 mg by mouth 2 (two) times a week.   oxyCODONE 5 MG immediate release tablet Commonly known as: Oxy IR/ROXICODONE Take 1-2 tablets (5-10 mg total) by mouth every 4 (four) hours as needed for severe pain.   polyethylene glycol 17 g packet Commonly known as: MIRALAX / GLYCOLAX Take 8.5 g by mouth daily.   QUERCETIN PO Take 1,000 mg by mouth 2 (two) times a week.   selenium 200 MCG Tabs tablet Take 200 mcg by mouth 2 (two) times a week.   sertraline 100 MG tablet Commonly known as: ZOLOFT Take 1 tablet (100 mg total) by mouth daily.   Systane Balance 0.6 % Soln Generic drug: Propylene Glycol Place 1 drop into both eyes 2 (two) times daily as needed (dry eyes).   timolol 0.5 % ophthalmic solution Commonly known as: TIMOPTIC Place 1 drop into both eyes daily.   Turmeric 500 MG Caps Take 500 mg by mouth daily.   vitamin B-12 1000 MCG tablet Commonly known as: CYANOCOBALAMIN Take 1,000 mcg by mouth 2 (two) times a week.   vitamin C 1000 MG tablet Take 1,000 mg by mouth 2 (two) times a week.   Vitamin D3 20 MCG (800 UNIT) Tabs Take 800 Units by mouth daily.   zinc gluconate 50 MG tablet Take 50 mg by mouth 2 (two) times a week.       ASK your doctor about these medications    terbinafine 250 MG tablet Commonly known as: LAMISIL Take 1 tablet (250 mg total) by mouth daily. Ask about: Should I take this medication?               Discharge Care Instructions  (From admission, onward)           Start     Ordered   01/17/22 0000  Change dressing       Comments: Maintain surgical dressing until follow up in the clinic. If the edges start to pull up, may reinforce with tape. If the dressing is no longer working, may remove and cover with gauze and tape, but must keep the area dry and clean.  Call with any questions or concerns.   01/17/22 1215            Diagnostic Studies: No results found.  Disposition:  Discharge disposition: 01-Home or Self Care       Discharge Instructions     Call MD / Call 911   Complete by: As directed    If you experience chest pain or shortness of breath, CALL 911 and be transported to the hospital emergency room.  If you develope a fever above 101 F, pus (white drainage) or increased drainage or redness at the wound, or  calf pain, call your surgeon's office.   Change dressing   Complete by: As directed    Maintain surgical dressing until follow up in the clinic. If the edges start to pull up, may reinforce with tape. If the dressing is no longer working, may remove and cover with gauze and tape, but must keep the area dry and clean.  Call with any questions or concerns.   Constipation Prevention   Complete by: As directed    Drink plenty of fluids.  Prune juice may be helpful.  You may use a stool softener, such as Colace (over the counter) 100 mg twice a day.  Use MiraLax (over the counter) for constipation as needed.   Diet - low sodium heart healthy   Complete by: As directed    Increase activity slowly as tolerated   Complete by: As directed    Weight bearing as tolerated with assist device (walker, cane, etc) as directed, use it as long as suggested by your surgeon or therapist, typically at least 4-6 weeks.   Post-operative opioid taper instructions:   Complete by: As directed    POST-OPERATIVE OPIOID TAPER INSTRUCTIONS: It is important to wean off of your opioid medication as soon as possible. If you do not need pain medication after your surgery it is ok to stop day one. Opioids include: Codeine, Hydrocodone(Norco, Vicodin), Oxycodone(Percocet, oxycontin) and hydromorphone amongst others.  Long term and even short term use of opiods can cause: Increased pain response Dependence Constipation Depression Respiratory depression And more.  Withdrawal symptoms can include Flu like symptoms Nausea, vomiting And more Techniques to manage these  symptoms Hydrate well Eat regular healthy meals Stay active Use relaxation techniques(deep breathing, meditating, yoga) Do Not substitute Alcohol to help with tapering If you have been on opioids for less than two weeks and do not have pain than it is ok to stop all together.  Plan to wean off of opioids This plan should start within one week post op of your joint replacement. Maintain the same interval or time between taking each dose and first decrease the dose.  Cut the total daily intake of opioids by one tablet each day Next start to increase the time between doses. The last dose that should be eliminated is the evening dose.      TED hose   Complete by: As directed    Use stockings (TED hose) for 2 weeks on both leg(s).  You may remove them at night for sleeping.        Follow-up Information     Paralee Cancel, MD. Schedule an appointment as soon as possible for a visit in 2 week(s).   Specialty: Orthopedic Surgery Contact information: [address] [place] [address] [phone]                  Signed: Irving Copas 01/25/2022, 9:59 AM

## 2022-05-18 ENCOUNTER — Other Ambulatory Visit: Payer: Self-pay | Admitting: Urology

## 2022-06-08 ENCOUNTER — Encounter (HOSPITAL_BASED_OUTPATIENT_CLINIC_OR_DEPARTMENT_OTHER): Payer: Self-pay | Admitting: Urology

## 2022-06-08 NOTE — Progress Notes (Signed)
Spoke w/ via phone for pre-op interview--- pt Lab needs dos----  no             Lab results------ no COVID test -----patient states asymptomatic no test needed Arrive at ------- 1130 on 06-19-2022 NPO after MN NO Solid Food.  Clear liquids from MN until--- 1030 Med rec completed Medications to take morning of surgery ----- zyrtec, eye drops Diabetic medication ----- n/a Patient instructed no nail polish to be worn day of surgery Patient instructed to bring photo id and insurance card day of surgery Patient aware to have Driver (ride ) / caregiver for 24 hours after surgery -- husband, kip Patient Special Instructions ----- n/a Pre-Op special Istructions ----- n/a Patient verbalized understanding of instructions that were given at this phone interview. Patient denies shortness of breath, chest pain, fever, cough at this phone interview.

## 2022-06-18 NOTE — H&P (Signed)
CC/HPI: cc: urinary incontinence   12/01/20: 71 year old woman with 20+ year history of urinary incontinence referred by her gyn Dr. Marvel Plan. Patient has always worn a small pad however she has noticed worsening incontinence and her underwear being wet on the edges of her pad over the last few years. She had a urodynamics about 20 years ago. Last year she try pelvic floor physical therapy which did not help for her urinary incontinence. 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 The urge component is not very bothersome to the patient. She does say that she feels like she is constantly wet and leaking. She has had 1 vaginal childbirth at age 68. She denies history of UTIs, kidney stones or gross hematuria.    01/20/21: 71 year old woman with urinary incontinence returns for follow-up after urodynamic study. Patient does think her urinary incontinence is actually stable over the last few years. She thinks that the underwear being wet the edges may be due to sweating. She is still very bothered by her stress urinary incontinence.   UDS SUMMARY  Ms. Shadix held a max capacity of approx. 460 mls. Her 1st sensation was felt at 257 mls. There was positive SUI with mild leakage noted. No instability was noted during the study. However, she did tell me that if she holds her urine "too long" she will have some leakage with urgency. She was able to generate a voluntary contraction and void. Her flow was a bit interrupted. Increased EMG activity was noted during voiding. PVR was approx. 263 mls (only had 75 mls upon arrival). No reflux was noted.   05/16/22: 71 year old woman with a history of stress urinary continence here for follow-up. Her incontinence has been worsening over the last several months. We have previously discussed procedural intervention such as Bulkamid versus sling however patient ended up needing a knee replacement and had to postpone this. She comes in today wanting  to discuss Bulkamid. Urodynamic study done last year did confirm stress urinary incontinence.     ALLERGIES: No Allergies    MEDICATIONS: Calcium  Cetirizine Hcl 10 mg tablet  Fish Oil CAPS Oral  Latanoprost 0.005 % drops Ophthalmic  Miralax  Restasis 0.05 % Ophthalmic Emulsion Ophthalmic  Sertraline Hcl 100 mg tablet  Timolol Maleate 0.5 % drops Ophthalmic  Vitamin B-12 100 mcg tablet Oral  Vitamin D (Ergocalciferol) 50000 UNIT Oral Capsule Oral     GU PSH: Complex cystometrogram, w/ void pressure and urethral pressure profile studies, any technique - 01/12/2021 Complex Uroflow - 01/12/2021 Emg surf Electrd - 01/12/2021 Inject For cystogram - 01/12/2021 Intrabd voidng Press - 01/12/2021     NON-GU PSH: Carpal tunnel surgery Hand/finger Surgery Scope; Plantar Fasciotomy Tubal Ligation - 2012     GU PMH: Stress Incontinence (Stable), Disc I discussed the results of patient's urodynamic study and management options for stress urinary incontinence. Patient is already tried pelvic floor physical therapy which did not help her very much. I think she may benefit from bulkamid as it's less invasive and less recovery. I talked about possible risks of the procedure including hematuria, urinary retention, acute cystitis, damage to surrounding structures, no improvement in incontinence. Patient would like to proceed. - 01/20/2021, Stress incontinence of urine, - 2017 Urinary Urgency - 01/20/2021 Mixed incontinence - 01/12/2021, I would like for patient to have urodynamic study to better characterize her incontinence. It does not seem that urge incontinence is bothersome to her in A1 and sure she is adequately emptying before we  addressed stress urinary incontinence. I briefly discussed with patient bulkamid and MUS for SUI. She will follow up after UDS has been done. , - 12/01/2020, - 2020, - 2020, - 2020, - 2020, - 2020, - 2020 Microscopic hematuria, If still present at next visit will proceed with a  hematuria workup including upper tract imaging and cystoscopy. - 12/01/2020 Dysuria, Dysuria - 2014      PMH Notes:  2011-09-27 15:38:09 - Note: Plantar Fasciitis  2011-09-27 15:39:50 - Note: Acrochordon   NON-GU PMH: Constipation, unspecified - 2020, - 2020, - 2020 Fecal smearing - 2020, - 2020, - 2020 Muscle weakness (generalized) - 2020, - 2020, - 2020, - 2020, - 2020, - 2020, - 2020 Other muscle spasm - 2020, - 2020, - 2020, - 2020, - 2020, - 2020, - 2020 Other specified disorders of muscle - 2020, - 2020, - 2020, - 2020, - 2020, - 2020, - 2020 Slow transit constipation - 2020 Encounter for general adult medical examination without abnormal findings, Encounter for preventive health examination - 2017 Personal history of other diseases of the circulatory system, History of hypertension - 2017 Personal history of other diseases of the musculoskeletal system and connective tissue, History of osteopenia - 2014, History of low back pain, - 2014 Personal history of other diseases of the nervous system and sense organs, History of carpal tunnel syndrome - 2014, History of glaucoma, - 2014 Personal history of other endocrine, nutritional and metabolic disease, History of hyperlipidemia - 2014, History of obesity, - 2014 Personal history of other mental and behavioral disorders, History of depression - 2014 Spinal stenosis, site unspecified, Spinal Stenosis - 2014 Trochanteric bursitis, unspecified hip, Trochanteric Bursitis - 2014 Anxiety Depression Glaucoma Hypercholesterolemia    FAMILY HISTORY: Bladder Cancer - Father COPD (chronic obstructive pulmonary disease) with - Father Death In The Family Father - 52 In [place] is 1 daughter and 1 - Runs In Family Family Health Status Of Mother - Alive 108 - Runs In Family Hematuria - Father   SOCIAL HISTORY: Marital Status: Married Current Smoking Status: Patient does not smoke anymore. Has not smoked since  06/10/1979. Smoked for 4 years. Smoked 1 pack per day.   Tobacco Use Assessment Completed: Used Tobacco in last 30 days? Drinks 1 caffeinated drink per day. 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     Notes: Occupation, Caffeine Use, Alcohol Use (History), Number of children, Former smoker, Marital History - Currently Married   REVIEW OF SYSTEMS:    GU Review Female:   Patient denies frequent urination, hard to postpone urination, burning /pain with urination, get up at night to urinate, leakage of urine, stream starts and stops, trouble starting your stream, have to strain to urinate, and being pregnant.  Gastrointestinal (Upper):   Patient denies nausea, vomiting, and indigestion/ heartburn.  Gastrointestinal (Lower):   Patient denies diarrhea and constipation.  Constitutional:   Patient denies fever, night sweats, weight loss, and fatigue.  Skin:   Patient denies skin rash/ lesion and itching.  Eyes:   Patient denies blurred vision and double vision.  Ears/ Nose/ Throat:   Patient denies sore throat and sinus problems.  Hematologic/Lymphatic:   Patient denies swollen glands and easy bruising.  Cardiovascular:   Patient denies leg swelling and chest pains.  Respiratory:   Patient denies cough and shortness of breath.  Endocrine:   Patient denies excessive thirst.  Musculoskeletal:   Patient denies back pain and joint pain.  Neurological:   Patient denies headaches and  dizziness.  Psychologic:   Patient denies depression and anxiety.   VITAL SIGNS:      05/16/2022 12:57 PM  BP 137/84 mmHg  Pulse 76 /min  Temperature 97.8 F / 36.5 C   MULTI-SYSTEM PHYSICAL EXAMINATION:    Constitutional: Well-nourished. No physical deformities. Normally developed. Good grooming.  Neck: Neck symmetrical, not swollen. Normal tracheal position.  Respiratory: No labored breathing, no use of accessory muscles.   Skin: No paleness, no jaundice, no cyanosis. No lesion, no ulcer, no rash.  Neurologic /  Psychiatric: Oriented to time, oriented to place, oriented to person. No depression, no anxiety, no agitation.  Eyes: Normal conjunctivae. Normal eyelids.  Ears, Nose, Mouth, and Throat: Left ear no scars, no lesions, no masses. Right ear no scars, no lesions, no masses. Nose no scars, no lesions, no masses. Normal hearing. Normal lips.  Musculoskeletal: Normal gait and station of head and neck.     Complexity of Data:  Records Review:   Previous Patient Records, POC Tool  Urine Test Review:   Urinalysis  Urodynamics Review:   Review Bladder Scan  Notes:                     02/06/2022: BUN 15, creatinine 0.69   PROCEDURES:         PVR Ultrasound - 58832  Scanned Volume: 0 cc   ASSESSMENT:      ICD-10 Details  1 GU:   Stress Incontinence - N39.3 Chronic, Stable  2   Urinary Urgency - R39.15 Chronic, Stable   PLAN:           Document Letter(s):  Created for Patient: Clinical Summary         Notes:   Stress urinary continence:  -I again discussed with patient Bulkamid versus mid urethral sling  -Gave patient informational handout on Bulkamid and discussed success rates and possible need for retreatment as well as risks/benefits  -Patient was counseled on possibility of urinary retention  -She would like to proceed with Bulkamid under sedation in the operating room

## 2022-06-19 ENCOUNTER — Ambulatory Visit (HOSPITAL_BASED_OUTPATIENT_CLINIC_OR_DEPARTMENT_OTHER)
Admission: RE | Admit: 2022-06-19 | Discharge: 2022-06-19 | Disposition: A | Discharge status: Home / Self Care | Payer: Medicare HMO | Attending: Urology | Admitting: Urology

## 2022-06-19 ENCOUNTER — Ambulatory Visit (HOSPITAL_BASED_OUTPATIENT_CLINIC_OR_DEPARTMENT_OTHER): Payer: Medicare HMO | Admitting: Anesthesiology

## 2022-06-19 ENCOUNTER — Other Ambulatory Visit: Payer: Self-pay

## 2022-06-19 ENCOUNTER — Encounter (HOSPITAL_BASED_OUTPATIENT_CLINIC_OR_DEPARTMENT_OTHER): Payer: Self-pay | Admitting: Urology

## 2022-06-19 ENCOUNTER — Encounter (HOSPITAL_BASED_OUTPATIENT_CLINIC_OR_DEPARTMENT_OTHER)
Admission: RE | Disposition: A | Discharge status: Home / Self Care | Payer: Self-pay | Source: Home / Self Care | Attending: Urology

## 2022-06-19 DIAGNOSIS — Z01818 Encounter for other preprocedural examination: Secondary | ICD-10-CM

## 2022-06-19 DIAGNOSIS — N393 Stress incontinence (female) (male): Secondary | ICD-10-CM | POA: Insufficient documentation

## 2022-06-19 DIAGNOSIS — Z87891 Personal history of nicotine dependence: Secondary | ICD-10-CM | POA: Insufficient documentation

## 2022-06-19 DIAGNOSIS — R3915 Urgency of urination: Secondary | ICD-10-CM | POA: Insufficient documentation

## 2022-06-19 HISTORY — DX: Stress incontinence (female) (male): N39.3

## 2022-06-19 HISTORY — DX: Other bursitis of hip, right hip: M70.71

## 2022-06-19 HISTORY — DX: Other intervertebral disc degeneration, lumbar region: M51.36

## 2022-06-19 HISTORY — DX: Other secondary scoliosis, site unspecified: M41.50

## 2022-06-19 HISTORY — PX: CYSTOSCOPY WITH INJECTION: SHX1424

## 2022-06-19 HISTORY — DX: Unspecified glaucoma: H40.9

## 2022-06-19 HISTORY — DX: Impingement syndrome of right shoulder: M75.41

## 2022-06-19 HISTORY — DX: Presence of spectacles and contact lenses: Z97.3

## 2022-06-19 HISTORY — DX: Generalized anxiety disorder: F41.1

## 2022-06-19 SURGERY — CYSTOSCOPY, WITH INJECTION OF BLADDER NECK OR BLADDER WALL
Anesthesia: General | Site: Bladder

## 2022-06-19 MED ORDER — FENTANYL CITRATE (PF) 100 MCG/2ML IJ SOLN
INTRAMUSCULAR | Status: DC | PRN
  Administered 2022-06-19 (×3): 25 ug via INTRAVENOUS

## 2022-06-19 MED ORDER — ACETAMINOPHEN 500 MG PO TABS
ORAL_TABLET | ORAL | Status: AC
  Filled 2022-06-19: qty 2

## 2022-06-19 MED ORDER — ACETAMINOPHEN 500 MG PO TABS: 1000.0000 mg | ORAL_TABLET | Freq: Once | ORAL | Status: DC

## 2022-06-19 MED ORDER — FENTANYL CITRATE (PF) 100 MCG/2ML IJ SOLN
INTRAMUSCULAR | Status: AC
  Filled 2022-06-19: qty 2

## 2022-06-19 MED ORDER — MIDAZOLAM HCL 2 MG/2ML IJ SOLN
INTRAMUSCULAR | Status: AC
  Filled 2022-06-19: qty 2

## 2022-06-19 MED ORDER — CEFAZOLIN SODIUM-DEXTROSE 2-4 GM/100ML-% IV SOLN
INTRAVENOUS | Status: AC
  Filled 2022-06-19: qty 100

## 2022-06-19 MED ORDER — LIDOCAINE 2% (20 MG/ML) 5 ML SYRINGE
INTRAMUSCULAR | Status: DC | PRN
  Administered 2022-06-19: 80 mg via INTRAVENOUS

## 2022-06-19 MED ORDER — CEFAZOLIN SODIUM-DEXTROSE 2-4 GM/100ML-% IV SOLN
2.0000 g | INTRAVENOUS | Status: AC
  Administered 2022-06-19: 2 g via INTRAVENOUS

## 2022-06-19 MED ORDER — ONDANSETRON HCL 4 MG/2ML IJ SOLN
INTRAMUSCULAR | Status: DC | PRN
  Administered 2022-06-19: 4 mg via INTRAVENOUS

## 2022-06-19 MED ORDER — WATER FOR IRRIGATION, STERILE IR SOLN
Status: DC | PRN
  Administered 2022-06-19: 500 mL via SURGICAL_CAVITY

## 2022-06-19 MED ORDER — PROPOFOL 10 MG/ML IV BOLUS
INTRAVENOUS | Status: DC | PRN
  Administered 2022-06-19: 60 mg via INTRAVENOUS
  Administered 2022-06-19: 140 mg via INTRAVENOUS

## 2022-06-19 MED ORDER — ONDANSETRON HCL 4 MG/2ML IJ SOLN
INTRAMUSCULAR | Status: AC
  Filled 2022-06-19: qty 2

## 2022-06-19 MED ORDER — DEXAMETHASONE SODIUM PHOSPHATE 10 MG/ML IJ SOLN
INTRAMUSCULAR | Status: DC | PRN
  Administered 2022-06-19: 10 mg via INTRAVENOUS

## 2022-06-19 MED ORDER — LACTATED RINGERS IV SOLN: INTRAVENOUS | Status: DC

## 2022-06-19 MED ORDER — PHENYLEPHRINE 80 MCG/ML (10ML) SYRINGE FOR IV PUSH (FOR BLOOD PRESSURE SUPPORT)
PREFILLED_SYRINGE | INTRAVENOUS | Status: DC | PRN
  Administered 2022-06-19: 240 ug via INTRAVENOUS
  Administered 2022-06-19: 160 ug via INTRAVENOUS

## 2022-06-19 MED ORDER — PHENYLEPHRINE 80 MCG/ML (10ML) SYRINGE FOR IV PUSH (FOR BLOOD PRESSURE SUPPORT)
PREFILLED_SYRINGE | INTRAVENOUS | Status: AC
  Filled 2022-06-19: qty 10

## 2022-06-19 MED ORDER — MIDAZOLAM HCL 5 MG/5ML IJ SOLN
INTRAMUSCULAR | Status: DC | PRN
  Administered 2022-06-19: 2 mg via INTRAVENOUS

## 2022-06-19 MED ORDER — PROPOFOL 10 MG/ML IV BOLUS
INTRAVENOUS | Status: AC
  Filled 2022-06-19: qty 20

## 2022-06-19 SURGICAL SUPPLY — 21 items
BAG DRAIN URO-CYSTO SKYTR STRL (DRAIN) ×2 IMPLANT
BAG DRN UROCATH (DRAIN) ×1
CLOTH BEACON ORANGE TIMEOUT ST (SAFETY) ×2 IMPLANT
ELECT REM PT RETURN 9FT ADLT (ELECTROSURGICAL) ×2
ELECTRODE REM PT RTRN 9FT ADLT (ELECTROSURGICAL) ×1 IMPLANT
GLOVE BIO SURGEON STRL SZ 6.5 (GLOVE) ×2 IMPLANT
GOWN STRL REUS W/TWL LRG LVL3 (GOWN DISPOSABLE) ×2 IMPLANT
KIT TURNOVER CYSTO (KITS) ×2 IMPLANT
MANIFOLD NEPTUNE II (INSTRUMENTS) ×2 IMPLANT
NDL ASPIRATION 22 (NEEDLE) ×1 IMPLANT
NDL SAFETY ECLIPSE 18X1.5 (NEEDLE) ×1 IMPLANT
NEEDLE ASPIRATION 22 (NEEDLE) ×2 IMPLANT
NEEDLE HYPO 18GX1.5 SHARP (NEEDLE) ×2
PACK CYSTO (CUSTOM PROCEDURE TRAY) ×2 IMPLANT
SYR 20ML LL LF (SYRINGE) ×2 IMPLANT
SYR CONTROL 10ML LL (SYRINGE) ×2 IMPLANT
SYSTEM URETHRAL BULK BULKAMID (Female Continence) ×2 IMPLANT
TUBE CONNECTING 12X1/4 (SUCTIONS) ×2 IMPLANT
TUBING UROLOGY SET (TUBING) ×1 IMPLANT
WATER STERILE IRR 3000ML UROMA (IV SOLUTION) ×2 IMPLANT
WATER STERILE IRR 500ML POUR (IV SOLUTION) ×1 IMPLANT

## 2022-06-19 NOTE — Interval H&P Note (Signed)
History and Physical Interval Note:  06/19/2022 12:16 PM  Deborah Fowler  has presented today for surgery, with the diagnosis of STRESS URINARY INCONTINENCE.  The various methods of treatment have been discussed with the patient and family. After consideration of risks, benefits and other options for treatment, the patient has consented to  Procedure(s): CYSTOSCOPY WITH INJECTION OF BULKAMID (N/A) as a surgical intervention.  The patient's history has been reviewed, patient examined, no change in status, stable for surgery.  I have reviewed the patient's chart and labs.  Questions were answered to the patient's satisfaction.     Arletta Lumadue D Ladainian Therien

## 2022-06-19 NOTE — Anesthesia Procedure Notes (Signed)
Procedure Name: LMA Insertion Date/Time: 06/19/2022 1:16 PM  Performed by: Rogers Blocker, CRNAPre-anesthesia Checklist: Patient identified, Emergency Drugs available, Suction available and Patient being monitored Patient Re-evaluated:Patient Re-evaluated prior to induction Oxygen Delivery Method: Circle System Utilized Preoxygenation: Pre-oxygenation with 100% oxygen Induction Type: IV induction Ventilation: Mask ventilation without difficulty LMA: LMA inserted LMA Size: 4.0 Number of attempts: 1 Placement Confirmation: positive ETCO2 Tube secured with: Tape Dental Injury: Teeth and Oropharynx as per pre-operative assessment

## 2022-06-19 NOTE — Transfer of Care (Signed)
Immediate Anesthesia Transfer of Care Note  Patient: Deborah Fowler  Procedure(s) Performed: CYSTOSCOPY WITH INJECTION OF BULKAMID (Bladder)  Patient Location: PACU  Anesthesia Type:General  Level of Consciousness: awake, alert , oriented and patient cooperative  Airway & Oxygen Therapy: Patient Spontanous Breathing  Post-op Assessment: Report given to RN and Post -op Vital signs reviewed and stable  Post vital signs: Reviewed and stable  Last Vitals:  Vitals Value Taken Time  BP 116/78 06/19/22 1348  Temp    Pulse 64 06/19/22 1349  Resp 9 06/19/22 1348  SpO2 93 % 06/19/22 1349  Vitals shown include unvalidated device data.  Last Pain:  Vitals:   06/19/22 1153  TempSrc: Oral  PainSc: 0-No pain      Patients Stated Pain Goal: 3 (46/00/29 [id number])  Complications: No notable events documented.

## 2022-06-19 NOTE — Anesthesia Postprocedure Evaluation (Signed)
Anesthesia Post Note  Patient: Deborah Fowler  Procedure(s) Performed: CYSTOSCOPY WITH INJECTION OF BULKAMID (Bladder)     Patient location during evaluation: PACU Anesthesia Type: General Level of consciousness: awake and alert Pain management: pain level controlled Vital Signs Assessment: post-procedure vital signs reviewed and stable Respiratory status: spontaneous breathing, nonlabored ventilation, respiratory function stable and patient connected to nasal cannula oxygen Cardiovascular status: blood pressure returned to baseline and stable Postop Assessment: no apparent nausea or vomiting Anesthetic complications: no   No notable events documented.  Last Vitals:  Vitals:   06/19/22 1354 06/19/22 1407  BP:  132/83  Pulse: 65 71  Resp: 18 (!) 23  Temp: 36.4 C   SpO2: 99% 100%    Last Pain:  Vitals:   06/19/22 1407  TempSrc:   PainSc: 0-No pain                 Santa Lighter

## 2022-06-19 NOTE — Op Note (Signed)
Operative Note   Preoperative diagnosis:  1.  Stress urinary incontinence   Postoperative diagnosis: 1.  Stress urinary incontinence   Procedure(s): 1.  Cystoscopy with injection of bulkamid   Surgeon: Jacalyn Lefevre, MD   Assistants:  None   Anesthesia:  General   Complications:  None   EBL:  minimal   Specimens: 1. none   Drains/Catheters: 1.  none   Intraoperative findings:   Normal urethra   Indication:  Deborah Fowler is a 72 yo woman with symptomatic stress urinary incontinence.   Description of procedure:   After risks and benefits of the procedure discussed with the patient, informed consent was obtained.  The patient was taken to the operating placed in the supine position.  Anesthesia was induced and antibiotics were administered.  The patient was then repositioned in the dorsolithotomy position.  She was prepped and draped in usual sterile fashion a timeout performed with the attending present.  The cystoscope was assembled with the Bulkamid system.  It was then placed in the urethral meatus and advanced into the bladder under direct visualization.  Prior cystoscopy had been done which noted normal anatomic landmarks.  These were again verified during cystoscopy today.  The cystoscope was brought back to the bladder neck and the needle was advanced through the needle guide at the 1 o'clock position.  Once it was visualized and advanced it was rotated to the 5 o'clock position.  Bulkamid was then injected until blood was seen.  This was then repeated at the 1 o'clock position in the 7 o'clock position until coaptation was noted.   This concluded the case.  The patient's bladder was left with approximately 200 cc of sterile saline.  The patient emerged from anesthesia and was transferred the PACU in stable condition.   Plan:  Plan for patient to void in PACU prior to discharge.

## 2022-06-19 NOTE — Discharge Instructions (Addendum)
Cystoscopy patient instructions  Following a cystoscopy, a catheter (a flexible rubber tube) is sometimes left in place to empty the bladder. This may cause some discomfort or a feeling that you need to urinate. Your doctor determines the period of time that the catheter will be left in place. You may have bloody urine for two to three days (Call your doctor if the amount of bleeding increases or does not subside).  You may pass blood clots in your urine, especially if you had a biopsy. It is not unusual to pass small blood clots and have some bloody urine a couple of weeks after your cystoscopy. Again, call your doctor if the bleeding does not subside. You may have: Dysuria (painful urination) Frequency (urinating often) Urgency (strong desire to urinate)  These symptoms are common especially if medicine is instilled into the bladder or a ureteral stent is placed. Avoiding alcohol and caffeine, such as coffee, tea, and chocolate, may help relieve these symptoms. Drink plenty of water, unless otherwise instructed. Your doctor may also prescribe an antibiotic or other medicine to reduce these symptoms.  Cystoscopy results are available soon after the procedure; biopsy results usually take two to four days. Your doctor will discuss the results of your exam with you. Before you go home, you will be given specific instructions for follow-up care. Special Instructions:   If you are going home with a catheter in place do not take a tub bath until removed by your doctor.   You may resume your normal activities.   Do not drive or operate machinery if you are taking narcotic pain medicine.   Be sure to keep all follow-up appointments with your doctor.   Call Your Doctor If: The catheter is not draining You have severe pain You are unable to urinate You have a fever over 101 You have severe bleeding          You can use over the counter AZO for burning with urination.  Tylenol (acetaminophen) can  help with bladder discomfort. Post Anesthesia Home Care Instructions  Activity: Get plenty of rest for the remainder of the day. A responsible adult should stay with you for 24 hours following the procedure.  For the next 24 hours, DO NOT: -Drive a car -Paediatric nurse -Drink alcoholic beverages -Take any medication unless instructed by your physician -Make any legal decisions or sign important papers.  Meals: Start with liquid foods such as gelatin or soup. Progress to regular foods as tolerated. Avoid greasy, spicy, heavy foods. If nausea and/or vomiting occur, drink only clear liquids until the nausea and/or vomiting subsides. Call your physician if vomiting continues.  Special Instructions/Symptoms: Your throat may feel dry or sore from the anesthesia or the breathing tube placed in your throat during surgery. If this causes discomfort, gargle with warm salt water. The discomfort should disappear within 24 hours.

## 2022-06-19 NOTE — Anesthesia Preprocedure Evaluation (Signed)
Anesthesia Evaluation  Patient identified by MRN, date of birth, ID band Patient awake    Reviewed: Allergy & Precautions, NPO status , Patient's Chart, lab work & pertinent test results  History of Anesthesia Complications (+) PONV and history of anesthetic complications  Airway Mallampati: III  TM Distance: >3 FB Neck ROM: Full    Dental  (+) Teeth Intact, Dental Advisory Given   Pulmonary former smoker,    Pulmonary exam normal breath sounds clear to auscultation       Cardiovascular negative cardio ROS Normal cardiovascular exam Rhythm:Regular Rate:Normal     Neuro/Psych PSYCHIATRIC DISORDERS Anxiety Depression negative neurological ROS     GI/Hepatic negative GI ROS, Neg liver ROS,   Endo/Other  negative endocrine ROSObesity   Renal/GU negative Renal ROS     Musculoskeletal  (+) Arthritis ,   Abdominal   Peds  Hematology negative hematology ROS (+)   Anesthesia Other Findings Day of surgery medications reviewed with the patient.  Reproductive/Obstetrics                             Anesthesia Physical Anesthesia Plan  ASA: 2  Anesthesia Plan: General   Post-op Pain Management: Tylenol PO (pre-op)*   Induction: Intravenous  PONV Risk Score and Plan: 4 or greater and Dexamethasone and Ondansetron  Airway Management Planned: LMA  Additional Equipment:   Intra-op Plan:   Post-operative Plan: Extubation in OR  Informed Consent: I have reviewed the patients History and Physical, chart, labs and discussed the procedure including the risks, benefits and alternatives for the proposed anesthesia with the patient or authorized representative who has indicated his/her understanding and acceptance.     Dental advisory given  Plan Discussed with: CRNA  Anesthesia Plan Comments:         Anesthesia Quick Evaluation

## 2022-06-20 ENCOUNTER — Encounter (HOSPITAL_BASED_OUTPATIENT_CLINIC_OR_DEPARTMENT_OTHER): Payer: Self-pay | Admitting: Urology

## 2022-08-13 NOTE — Progress Notes (Signed)
Synopsis: Referred in September 2023 for chronic cough  Subjective:   PATIENT ID: Deborah Fowler GENDER: female DOB: November 20, 1951, MRN: [id number]   HPI  Chief Complaint  Patient presents with   Consult    Pt states she has had a cough since getting covid in 2020. Pt states if she talks a lot, she will begin to cough.     Cough: > has been a problem since COVID in 2020 > worse when talking a lot > sometimes has trouble swallowing > someimtes trouble swallowing, it's rare > she doesn't have heart burn > doesn't wake her up at night > rarely produces a little plegm from her throat > yellow, not green or bloody > no fevers or chills > no dyspnea > weather doesn't make a difference > coughing spells will come and go > it doesn't happen all day  Smoke for 4 years, quit in 1980.  Never had a lung problem.    She has post nasal drip from allergies > she takes cetirizine > she still has a lot of runny nose > it's watery, runs > worse when she cooks > no itchy eyes or scratchy throat     Record review: February 2023 hospitalization records reviewed where the patient was seen for total knee replacement Records from his primary care physician's office from August 03, 2022 reviewed where he was seen by Dr. Danae Orleans ski.  He was seen for chronic cough which was noted to be related to postnasal drip, no gastroesophageal reflux disease reported.  No history of ACE inhibitor.  4-pack-year smoking history many years ago, quit in the long time ago.  Referred to [place] for further evaluation.  Past Medical History:  Diagnosis Date   Bursitis of both hips    Cervical arthritis    Cobalamin deficiency    DDD (degenerative disc disease), lumbar    Degenerative scoliosis    Depression    Generalized anxiety disorder    Glaucoma, both eyes    Impingement syndrome of both shoulders    followed by emerg orth--- left > right   Mixed hyperlipidemia    PONV (postoperative nausea and  vomiting)    SUI (stress urinary incontinence, female)    Tinnitus    intermittant   Wears glasses      Family History  Problem Relation Age of Onset   Hyperlipidemia Mother    Alcohol abuse Father    Heart attack Brother    Colon cancer Maternal Grandmother      Social History   Socioeconomic History   Marital status: Married    Spouse name: Not on file   Number of children: Not on file   Years of education: Not on file   Highest education level: Not on file  Occupational History   Not on file  Tobacco Use   Smoking status: Former    Packs/day: 1.00    Years: 4.00    Total pack years: 4.00    Types: Cigarettes    Start date: 12/10/1974    Quit date: 1980    Years since quitting: 43.7   Smokeless tobacco: Never  Vaping Use   Vaping Use: Never used  Substance and Sexual Activity   Alcohol use: Not Currently    Comment: rarely   Drug use: Not Currently    Comment: as a young adult marijuana and amphetamines  annd cocaine   Sexual activity: Yes  Other Topics Concern   Not on file  Social History  Narrative   Not on file   Social Determinants of Health   Financial Resource Strain: Not on file  Food Insecurity: Not on file  Transportation Needs: Not on file  Physical Activity: Not on file  Stress: Not on file  Social Connections: Not on file  Intimate Partner Violence: Not on file     No Known Allergies   Outpatient Medications Prior to Visit  Medication Sig Dispense Refill   Acetylcysteine (N-ACETYL-L-CYSTEINE PO) Take 600 mg by mouth 2 (two) times a week.     ALPRAZolam (XANAX) 0.5 MG tablet Take 0.5 mg by mouth at bedtime as needed.     Ascorbic Acid (VITAMIN C) 1000 MG tablet Take 1,000 mg by mouth 2 (two) times a week.     b complex vitamins capsule Take 1 capsule by mouth 2 (two) times a week.     Calcium Carbonate (CALCIUM 600 PO) Take 1,200 mg by mouth daily.     celecoxib (CELEBREX) 200 MG capsule Take 1 capsule (200 mg total) by mouth 2 (two)  times daily. 60 capsule 0   cetirizine (ZYRTEC) 10 MG tablet Take 10 mg by mouth daily.     Cholecalciferol (VITAMIN D3) 20 MCG (800 UNIT) TABS Take 800 Units by mouth daily.     cycloSPORINE (RESTASIS) 0.05 % ophthalmic emulsion Place 1 drop into both eyes 2 (two) times daily.     Flaxseed, Linseed, (FLAXSEED OIL) 1400 MG CAPS Take 1,400 mg by mouth 2 (two) times a week.     ibandronate (BONIVA) 150 MG tablet Take 150 mg by mouth every 30 (thirty) days. Take in the morning with a full glass of water, on an empty stomach, and do not take anything else by mouth or lie down for the next 30 min.     latanoprost (XALATAN) 0.005 % ophthalmic solution Place 1 drop into both eyes at bedtime.     Magnesium 250 MG TABS Take 250 mg by mouth 2 (two) times a week.     Omega-3 Fatty Acids (FISH OIL) 1000 MG CAPS Take 1,000 mg by mouth 2 (two) times a week.     Propylene Glycol (SYSTANE BALANCE) 0.6 % SOLN Place 1 drop into both eyes 2 (two) times daily as needed (dry eyes).     QUERCETIN PO Take 1,000 mg by mouth 2 (two) times a week.     selenium 200 MCG TABS tablet Take 200 mcg by mouth 2 (two) times a week.     sertraline (ZOLOFT) 100 MG tablet Take 1 tablet (100 mg total) by mouth daily. 90 tablet 0   timolol (TIMOPTIC) 0.5 % ophthalmic solution Place 1 drop into both eyes daily.     Turmeric 500 MG CAPS Take 500 mg by mouth daily.     vitamin B-12 (CYANOCOBALAMIN) 1000 MCG tablet Take 1,000 mcg by mouth daily.     zinc gluconate 50 MG tablet Take 50 mg by mouth 2 (two) times a week.     acetaminophen (TYLENOL) 325 MG tablet Take 3 tablets (975 mg total) by mouth every 6 (six) hours.     methocarbamol (ROBAXIN) 500 MG tablet Take 1 tablet (500 mg total) by mouth every 6 (six) hours as needed for muscle spasms. 40 tablet 0   oxyCODONE (OXY IR/ROXICODONE) 5 MG immediate release tablet Take 1-2 tablets (5-10 mg total) by mouth every 4 (four) hours as needed for severe pain. 56 tablet 0   No  facility-administered medications prior to visit.  ROS Gen: Denies fever, chills, weight change, fatigue, night sweats HEENT: Denies blurred vision, double vision, hearing loss, tinnitus, sinus congestion, rhinorrhea, sore throat, neck stiffness, dysphagia PULM: per HPI CV: Denies chest pain, edema, orthopnea, paroxysmal nocturnal dyspnea, palpitations GI: Denies abdominal pain, nausea, vomiting, diarrhea, hematochezia, melena, constipation, change in bowel habits GU: Denies dysuria, hematuria, polyuria, oliguria, urethral discharge Endocrine: Denies hot or cold intolerance, polyuria, polyphagia or appetite change Derm: Denies rash, dry skin, scaling or peeling skin change Heme: Denies easy bruising, bleeding, bleeding gums Neuro: Denies headache, numbness, weakness, slurred speech, loss of memory or consciousness    Objective:  Physical Exam   Vitals:   08/14/22 1557  BP: 130/76  Pulse: 86  Temp: 98.1 F (36.7 C)  TempSrc: Oral  SpO2: 97%  Weight: 202 lb 6.4 oz (91.8 kg)  Height: '5\' 4"'$  (1.626 m)   RA  Gen: well appearing, no acute distress HENT: NCAT, OP clear, neck supple without masses Eyes: PERRL, EOMi Lymph: no cervical lymphadenopathy PULM: CTA B CV: RRR, no mgr, no JVD GI: BS+, soft, nontender, no hsm Derm: no rash or skin breakdown MSK: normal bulk and tone Neuro: A&Ox4, CN II-XII intact, strength 5/5 in all 4 extremities Psyche: normal mood and affect   CBC    Component Value Date/Time   WBC 9.9 01/17/2022 0427   RBC 4.03 01/17/2022 0427   HGB 11.9 (L) 01/17/2022 0427   HGB 13.3 06/09/2019 0954   HCT 37.5 01/17/2022 0427   HCT 40.3 06/09/2019 0954   PLT 238 01/17/2022 0427   PLT 189 06/09/2019 0954   MCV 93.1 01/17/2022 0427   MCV 87 06/09/2019 0954   MCH 29.5 01/17/2022 0427   MCHC 31.7 01/17/2022 0427   RDW 13.9 01/17/2022 0427   RDW 13.8 06/09/2019 0954   LYMPHSABS 1.2 09/10/2019 1155   LYMPHSABS 2.0 06/09/2019 0954   MONOABS 0.4  09/10/2019 1155   EOSABS 0.1 09/10/2019 1155   EOSABS 0.2 06/09/2019 0954   BASOSABS 0.0 09/10/2019 1155   BASOSABS 0.0 06/09/2019 0954     Chest imaging: 11/2022 two view chest x-ray images independently reviewed normal pulmonary parenchyma, normal cardiac silhouette, tracheal air: Normal, no acute abnormality identified  PFT:  Labs:  Path:  Echo:  Heart Catheterization:       Assessment & Plan:   Cough, unspecified type - Plan: DG Chest 2 View  Upper airway cough syndrome  Non-seasonal allergic rhinitis due to pollen  Discussion: Pleasant 71 year old female presents with signs and symptoms consistent with upper airway cough syndrome exacerbated by allergic rhinitis and postnasal drip.  I think she likely has significant laryngeal irritation which has caused ongoing cough for several years.  Physical exam is normal and she has no other respiratory complaints I doubt there is a pulmonary component to this.  Plan: Allergic rhinitis Chronic cough: We will get a chest x-ray Take over-the-counter fluticasone nasal spray 2 sprays each nostril daily for 3 weeks If no improvement in postnasal drip or cough then call me and I will call in a prescription for something called ipratropium no spray which you can take three times a day for three weeks You need to try to suppress your cough to allow your larynx (voice box) to heal.  For three days don't talk, laugh, sing, or clear your throat. Do everything you can to suppress the cough during this time. Use hard candies (sugarless Jolly Ranchers) or non-mint or non-menthol containing cough drops during this time to soothe  your throat.  Use a cough suppressant (Delsym or what I have prescribed you) around the clock during this time.  After three days, gradually increase the use of your voice and back off on the cough suppressants.  If you are still coughing in 6 weeks then come back to see me and we can talk about medications which can  help suppress the cough    Immunizations:  There is no immunization history on file for this patient.   Current Outpatient Medications:    Acetylcysteine (N-ACETYL-L-CYSTEINE PO), Take 600 mg by mouth 2 (two) times a week., Disp: , Rfl:    ALPRAZolam (XANAX) 0.5 MG tablet, Take 0.5 mg by mouth at bedtime as needed., Disp: , Rfl:    Ascorbic Acid (VITAMIN C) 1000 MG tablet, Take 1,000 mg by mouth 2 (two) times a week., Disp: , Rfl:    b complex vitamins capsule, Take 1 capsule by mouth 2 (two) times a week., Disp: , Rfl:    Calcium Carbonate (CALCIUM 600 PO), Take 1,200 mg by mouth daily., Disp: , Rfl:    celecoxib (CELEBREX) 200 MG capsule, Take 1 capsule (200 mg total) by mouth 2 (two) times daily., Disp: 60 capsule, Rfl: 0   cetirizine (ZYRTEC) 10 MG tablet, Take 10 mg by mouth daily., Disp: , Rfl:    Cholecalciferol (VITAMIN D3) 20 MCG (800 UNIT) TABS, Take 800 Units by mouth daily., Disp: , Rfl:    cycloSPORINE (RESTASIS) 0.05 % ophthalmic emulsion, Place 1 drop into both eyes 2 (two) times daily., Disp: , Rfl:    Flaxseed, Linseed, (FLAXSEED OIL) 1400 MG CAPS, Take 1,400 mg by mouth 2 (two) times a week., Disp: , Rfl:    ibandronate (BONIVA) 150 MG tablet, Take 150 mg by mouth every 30 (thirty) days. Take in the morning with a full glass of water, on an empty stomach, and do not take anything else by mouth or lie down for the next 30 min., Disp: , Rfl:    latanoprost (XALATAN) 0.005 % ophthalmic solution, Place 1 drop into both eyes at bedtime., Disp: , Rfl:    Magnesium 250 MG TABS, Take 250 mg by mouth 2 (two) times a week., Disp: , Rfl:    Omega-3 Fatty Acids (FISH OIL) 1000 MG CAPS, Take 1,000 mg by mouth 2 (two) times a week., Disp: , Rfl:    Propylene Glycol (SYSTANE BALANCE) 0.6 % SOLN, Place 1 drop into both eyes 2 (two) times daily as needed (dry eyes)., Disp: , Rfl:    QUERCETIN PO, Take 1,000 mg by mouth 2 (two) times a week., Disp: , Rfl:    selenium 200 MCG TABS tablet,  Take 200 mcg by mouth 2 (two) times a week., Disp: , Rfl:    sertraline (ZOLOFT) 100 MG tablet, Take 1 tablet (100 mg total) by mouth daily., Disp: 90 tablet, Rfl: 0   timolol (TIMOPTIC) 0.5 % ophthalmic solution, Place 1 drop into both eyes daily., Disp: , Rfl:    Turmeric 500 MG CAPS, Take 500 mg by mouth daily., Disp: , Rfl:    vitamin B-12 (CYANOCOBALAMIN) 1000 MCG tablet, Take 1,000 mcg by mouth daily., Disp: , Rfl:    zinc gluconate 50 MG tablet, Take 50 mg by mouth 2 (two) times a week., Disp: , Rfl:

## 2022-08-14 ENCOUNTER — Ambulatory Visit (INDEPENDENT_AMBULATORY_CARE_PROVIDER_SITE_OTHER): Payer: Medicare HMO

## 2022-08-14 ENCOUNTER — Encounter: Payer: Self-pay | Admitting: Pulmonary Disease

## 2022-08-14 ENCOUNTER — Ambulatory Visit: Payer: Medicare HMO | Admitting: Pulmonary Disease

## 2022-08-14 VITALS — BP 130/76 | HR 86 | Temp 98.1°F | Ht 64.0 in | Wt 202.4 lb

## 2022-08-14 DIAGNOSIS — R058 Other specified cough: Secondary | ICD-10-CM

## 2022-08-14 DIAGNOSIS — J301 Allergic rhinitis due to pollen: Secondary | ICD-10-CM | POA: Diagnosis not present

## 2022-08-14 DIAGNOSIS — R059 Cough, unspecified: Secondary | ICD-10-CM

## 2022-08-14 NOTE — Patient Instructions (Signed)
Allergic rhinitis Chronic cough: We will get a chest x-ray Take over-the-counter fluticasone nasal spray 2 sprays each nostril daily for 3 weeks If no improvement in postnasal drip or cough then call me and I will call in a prescription for something called ipratropium no spray which you can take three times a day for three weeks You need to try to suppress your cough to allow your larynx (voice box) to heal.  For three days don't talk, laugh, sing, or clear your throat. Do everything you can to suppress the cough during this time. Use hard candies (sugarless Jolly Ranchers) or non-mint or non-menthol containing cough drops during this time to soothe your throat.  Use a cough suppressant (Delsym or what I have prescribed you) around the clock during this time.  After three days, gradually increase the use of your voice and back off on the cough suppressants.  If you are still coughing in 6 weeks then come back to see me and we can talk about medications which can help suppress the cough

## 2022-08-22 ENCOUNTER — Telehealth: Payer: Self-pay | Admitting: Pulmonary Disease

## 2022-08-22 NOTE — Telephone Encounter (Signed)
Patient would like the nurse to call regarding her chest x-ray results.  She stated she had some questions and would like clarification on the results.  CB# [phone]

## 2022-08-24 NOTE — Telephone Encounter (Signed)
Called and spoke to patient and she is just concerned about under findings   Mild chronic interstitial coarsening??  Please advise sir

## 2022-08-28 NOTE — Telephone Encounter (Signed)
Called patient and went over recommendations from [facility]. Pt denies the HRCT at this time. Will follow up with MQ as scheduled. Nothing further needed

## 2022-09-11 ENCOUNTER — Ambulatory Visit: Payer: Medicare HMO | Admitting: Pulmonary Disease

## 2022-09-11 ENCOUNTER — Encounter: Payer: Self-pay | Admitting: Pulmonary Disease

## 2022-09-11 VITALS — BP 132/84 | HR 73 | Ht 64.0 in | Wt 205.8 lb

## 2022-09-11 DIAGNOSIS — R058 Other specified cough: Secondary | ICD-10-CM | POA: Diagnosis not present

## 2022-09-11 DIAGNOSIS — R059 Cough, unspecified: Secondary | ICD-10-CM

## 2022-09-11 DIAGNOSIS — J301 Allergic rhinitis due to pollen: Secondary | ICD-10-CM

## 2022-09-11 DIAGNOSIS — R1319 Other dysphagia: Secondary | ICD-10-CM | POA: Diagnosis not present

## 2022-09-11 NOTE — Patient Instructions (Signed)
Allergic rhinitis: Keep taking Flonase as you are doing  Abnormal chest x-ray with history of dysphagia: We will get a high-resolution CT scan of your chest We will perform a barium esophagram to see if there is narrowing in your esophagus  Cough with dysphagia symptoms: As above Try taking over-the-counter Pepcid twice a day as directed on the packaging for 2 weeks  Follow-up with me in 4 weeks to go over these results

## 2022-09-11 NOTE — Addendum Note (Signed)
Addended by: Valerie Salts on: 09/11/2022 10:51 AM   Modules accepted: Orders

## 2022-09-11 NOTE — Progress Notes (Signed)
Synopsis: Referred in September 2023 for chronic cough, has rhinitis.  Subjective:   PATIENT ID: Deborah Fowler GENDER: female DOB: [birth year], MRN: [id number]   HPI  Chief Complaint  Patient presents with   Follow-up    F/U on CXR. Wants to discuss "Mild chronic interstitial coarsening" wording on recent CXR. Still has a cough, especially when eating or talking a lot.     Sinus congestion is better on flonase. Cough is still there  Has heartburn when she eats fatty or spicy foods  Cough is worse while eating.  She feels that she is getting "strangled more" when eating.  Rice made it worse. She has had problems with supplements getting stuck.   Last visit allergic rhinitis and iptratropium recommended, suggested consider other medications if no improvement   Past Medical History:  Diagnosis Date   Bursitis of both hips    Cervical arthritis    Cobalamin deficiency    DDD (degenerative disc disease), lumbar    Degenerative scoliosis    Depression    Generalized anxiety disorder    Glaucoma, both eyes    Impingement syndrome of both shoulders    followed by emerg orth--- left > right   Mixed hyperlipidemia    PONV (postoperative nausea and vomiting)    SUI (stress urinary incontinence, female)    Tinnitus    intermittant   Wears glasses        ROS Gen: Denies fever, chills, weight change, fatigue, night sweats HEENT: Denies blurred vision, double vision, hearing loss, tinnitus, sinus congestion, rhinorrhea, sore throat, neck stiffness, dysphagia PULM: per HPI CV: Denies chest pain, edema, orthopnea, paroxysmal nocturnal dyspnea, palpitations     Objective:  Physical Exam   Vitals:   09/11/22 0959  BP: 132/84  Pulse: 73  SpO2: 100%  Weight: 205 lb 12.8 oz (93.4 kg)  Height: '5\' 4"'$  (1.626 m)   RA  Gen: well appearing HENT: OP clear, TM's clear, neck supple PULM: CTA B, normal percussion CV: RRR, no mgr, trace edema GI: BS+, soft,  nontender Derm: no cyanosis or rash Psyche: normal mood and affect     CBC    Component Value Date/Time   WBC 9.9 01/17/2022 0427   RBC 4.03 01/17/2022 0427   HGB 11.9 (L) 01/17/2022 0427   HGB 13.3 06/09/2019 0954   HCT 37.5 01/17/2022 0427   HCT 40.3 06/09/2019 0954   PLT 238 01/17/2022 0427   PLT 189 06/09/2019 0954   MCV 93.1 01/17/2022 0427   MCV 87 06/09/2019 0954   MCH 29.5 01/17/2022 0427   MCHC 31.7 01/17/2022 0427   RDW 13.9 01/17/2022 0427   RDW 13.8 06/09/2019 0954   LYMPHSABS 1.2 09/10/2019 1155   LYMPHSABS 2.0 06/09/2019 0954   MONOABS 0.4 09/10/2019 1155   EOSABS 0.1 09/10/2019 1155   EOSABS 0.2 06/09/2019 0954   BASOSABS 0.0 09/10/2019 1155   BASOSABS 0.0 06/09/2019 0954     Chest imaging: 11/2022 two view chest x-ray images independently reviewed normal pulmonary parenchyma, normal cardiac silhouette, tracheal air: Normal, no acute abnormality identified  PFT:  Labs:  Path:  Echo:  Heart Catheterization:       Assessment & Plan:   Cough, unspecified type  Upper airway cough syndrome  Non-seasonal allergic rhinitis due to pollen  Esophageal dysphagia  Discussion: [name] says that her cough has not changed.  However, her postnasal drip is improved with treatment for allergic rhinitis.  Today she tells me that she has significant  swallowing difficulty with rice and supplements.  I wonder if her cough could be related to underlying gastroesophageal reflux.  Plan: Allergic rhinitis: Keep taking Flonase as you are doing  Abnormal chest x-ray with history of dysphagia: We will get a high-resolution CT scan of your chest We will perform a barium esophagram to see if there is narrowing in your esophagus  Cough with dysphagia symptoms: As above Try taking over-the-counter Pepcid twice a day as directed on the packaging for 2 weeks  Follow-up with me in 4 weeks to go over these results    Immunizations:  There is no immunization  history on file for this patient.   Current Outpatient Medications:    Acetylcysteine (N-ACETYL-L-CYSTEINE PO), Take 600 mg by mouth 2 (two) times a week., Disp: , Rfl:    ALPRAZolam (XANAX) 0.5 MG tablet, Take 0.5 mg by mouth at bedtime as needed., Disp: , Rfl:    Ascorbic Acid (VITAMIN C) 1000 MG tablet, Take 1,000 mg by mouth 2 (two) times a week., Disp: , Rfl:    b complex vitamins capsule, Take 1 capsule by mouth 2 (two) times a week., Disp: , Rfl:    Calcium Carbonate (CALCIUM 600 PO), Take 1,200 mg by mouth daily., Disp: , Rfl:    celecoxib (CELEBREX) 200 MG capsule, Take 1 capsule (200 mg total) by mouth 2 (two) times daily., Disp: 60 capsule, Rfl: 0   cetirizine (ZYRTEC) 10 MG tablet, Take 10 mg by mouth daily., Disp: , Rfl:    Cholecalciferol (VITAMIN D3) 20 MCG (800 UNIT) TABS, Take 800 Units by mouth daily., Disp: , Rfl:    cycloSPORINE (RESTASIS) 0.05 % ophthalmic emulsion, Place 1 drop into both eyes 2 (two) times daily., Disp: , Rfl:    Flaxseed, Linseed, (FLAXSEED OIL) 1400 MG CAPS, Take 1,400 mg by mouth 2 (two) times a week., Disp: , Rfl:    ibandronate (BONIVA) 150 MG tablet, Take 150 mg by mouth every 30 (thirty) days. Take in the morning with a full glass of water, on an empty stomach, and do not take anything else by mouth or lie down for the next 30 min., Disp: , Rfl:    latanoprost (XALATAN) 0.005 % ophthalmic solution, Place 1 drop into both eyes at bedtime., Disp: , Rfl:    Magnesium 250 MG TABS, Take 250 mg by mouth 2 (two) times a week., Disp: , Rfl:    Omega-3 Fatty Acids (FISH OIL) 1000 MG CAPS, Take 1,000 mg by mouth 2 (two) times a week., Disp: , Rfl:    Propylene Glycol (SYSTANE BALANCE) 0.6 % SOLN, Place 1 drop into both eyes 2 (two) times daily as needed (dry eyes)., Disp: , Rfl:    QUERCETIN PO, Take 1,000 mg by mouth 2 (two) times a week., Disp: , Rfl:    selenium 200 MCG TABS tablet, Take 200 mcg by mouth 2 (two) times a week., Disp: , Rfl:    sertraline  (ZOLOFT) 100 MG tablet, Take 1 tablet (100 mg total) by mouth daily., Disp: 90 tablet, Rfl: 0   timolol (TIMOPTIC) 0.5 % ophthalmic solution, Place 1 drop into both eyes daily., Disp: , Rfl:    Turmeric 500 MG CAPS, Take 500 mg by mouth daily., Disp: , Rfl:    vitamin B-12 (CYANOCOBALAMIN) 1000 MCG tablet, Take 1,000 mcg by mouth daily., Disp: , Rfl:    zinc gluconate 50 MG tablet, Take 50 mg by mouth 2 (two) times a week., Disp: , Rfl:

## 2022-09-19 ENCOUNTER — Encounter: Payer: Self-pay | Admitting: Pulmonary Disease

## 2022-09-19 ENCOUNTER — Ambulatory Visit: Payer: Medicare HMO | Admitting: Pulmonary Disease

## 2022-09-19 VITALS — BP 128/72 | HR 72 | Temp 98.4°F | Ht 64.0 in | Wt 205.8 lb

## 2022-09-19 DIAGNOSIS — R9389 Abnormal findings on diagnostic imaging of other specified body structures: Secondary | ICD-10-CM

## 2022-09-19 DIAGNOSIS — R1319 Other dysphagia: Secondary | ICD-10-CM

## 2022-09-19 DIAGNOSIS — J301 Allergic rhinitis due to pollen: Secondary | ICD-10-CM

## 2022-09-19 DIAGNOSIS — R059 Cough, unspecified: Secondary | ICD-10-CM | POA: Diagnosis not present

## 2022-09-19 DIAGNOSIS — J069 Acute upper respiratory infection, unspecified: Secondary | ICD-10-CM

## 2022-09-19 DIAGNOSIS — R058 Other specified cough: Secondary | ICD-10-CM | POA: Diagnosis not present

## 2022-09-19 MED ORDER — BENZONATATE 200 MG PO CAPS: 200.0000 mg | ORAL_CAPSULE | Freq: Three times a day (TID) | ORAL | 1 refills | Status: DC | PRN

## 2022-09-19 NOTE — Patient Instructions (Signed)
Cough due to laryngeal irritation: ENT referral for laryngoscopy Take tessalon '200mg'$  by mouth as needed for cough You need to try to suppress your cough to allow your larynx (voice box) to heal.  For three days don't talk, laugh, sing, or clear your throat. Do everything you can to suppress the cough during this time. Use hard candies (sugarless Jolly Ranchers) or non-mint or non-menthol containing cough drops during this time to soothe your throat.  Use a cough suppressant (Delsym or what I have prescribed you) around the clock during this time.  After three days, gradually increase the use of your voice and back off on the cough suppressants.  URI symptoms: Respiratory viral panel today  Concern for dysphagia Barium swallow  Abnormal chest x-ray: High-resolution CT scan of the chest  We will see you back as previously arranged

## 2022-09-19 NOTE — Progress Notes (Signed)
Synopsis: Referred in September 2023 for chronic cough, has rhinitis.  Subjective:   PATIENT ID: Deborah Fowler GENDER: female DOB: [birth year], MRN: [id number]   HPI  Chief Complaint  Patient presents with   Acute Visit    Pt states that she feels like her cough is getting worse after she talks a lot. States after having problems with her cough, she is completely worn out.    [name] returns to clinic today because she says her cough has worsened over the last week.  Specifically she said that she was talking to a friend on the phone for 30 minutes straight and she had a severe coughing spell afterwards.  She said that she felt fatigue and slept for 13 hours after.  She feels some muscle aches, low-grade chills.  No fever.  She feels like she is caught a cold.  No change in sinus congestion.  Indigestion and heartburn is the same as quoted last week.  She has not had a period of voice rest as recommended.   Past Medical History:  Diagnosis Date   Bursitis of both hips    Cervical arthritis    Cobalamin deficiency    DDD (degenerative disc disease), lumbar    Degenerative scoliosis    Depression    Generalized anxiety disorder    Glaucoma, both eyes    Impingement syndrome of both shoulders    followed by emerg orth--- left > right   Mixed hyperlipidemia    PONV (postoperative nausea and vomiting)    SUI (stress urinary incontinence, female)    Tinnitus    intermittant   Wears glasses        ROS Gen: Denies fever, chills, weight change, fatigue, night sweats HEENT: Denies blurred vision, double vision, hearing loss, tinnitus, sinus congestion, rhinorrhea, sore throat, neck stiffness, dysphagia PULM: per HPI CV: Denies chest pain, edema, orthopnea, paroxysmal nocturnal dyspnea, palpitations     Objective:  Physical Exam   Vitals:   09/19/22 1147  BP: 128/72  Pulse: 72  Temp: 98.4 F (36.9 C)  TempSrc: Oral  SpO2: 97%  Weight: 205 lb 12.8 oz (93.4 kg)   Height: '5\' 4"'$  (1.626 m)   RA  Gen: well appearing HENT: OP clear, TM's clear, neck supple PULM: CTA B, normal percussion CV: RRR, no mgr, trace edema GI: BS+, soft, nontender Derm: no cyanosis or rash Psyche: normal mood and affect     CBC    Component Value Date/Time   WBC 9.9 01/17/2022 0427   RBC 4.03 01/17/2022 0427   HGB 11.9 (L) 01/17/2022 0427   HGB 13.3 06/09/2019 0954   HCT 37.5 01/17/2022 0427   HCT 40.3 06/09/2019 0954   PLT 238 01/17/2022 0427   PLT 189 06/09/2019 0954   MCV 93.1 01/17/2022 0427   MCV 87 06/09/2019 0954   MCH 29.5 01/17/2022 0427   MCHC 31.7 01/17/2022 0427   RDW 13.9 01/17/2022 0427   RDW 13.8 06/09/2019 0954   LYMPHSABS 1.2 09/10/2019 1155   LYMPHSABS 2.0 06/09/2019 0954   MONOABS 0.4 09/10/2019 1155   EOSABS 0.1 09/10/2019 1155   EOSABS 0.2 06/09/2019 0954   BASOSABS 0.0 09/10/2019 1155   BASOSABS 0.0 06/09/2019 0954     Chest imaging: 11/2022 two view chest x-ray images independently reviewed normal pulmonary parenchyma, normal cardiac silhouette, tracheal air: Normal, no acute abnormality identified September 2023 chest x-ray mild chronic interstitial coarsening reported by radiology  PFT:  Labs:  Path:  Echo:  Heart  Catheterization:       Assessment & Plan:   Cough, unspecified type - Plan: Respiratory virus panel, Ambulatory referral to ENT  Upper airway cough syndrome  Non-seasonal allergic rhinitis due to pollen  Esophageal dysphagia  Abnormal CXR  Upper respiratory tract infection, unspecified type  Discussion: [name] describes symptoms of an acute viral infection.  As stated previously she has cough primarily due to laryngeal irritation.  I remain concerned about the possibility of dysphagia and possibly aspiration based on her reported history of food getting stuck in her chest, particularly rice.  She is asking about whether or not she should have a laryngoscopy.  This is  reasonable.   Plan: Cough due to laryngeal irritation: ENT referral for laryngoscopy Take tessalon '200mg'$  by mouth as needed for cough You need to try to suppress your cough to allow your larynx (voice box) to heal.  For three days don't talk, laugh, sing, or clear your throat. Do everything you can to suppress the cough during this time. Use hard candies (sugarless Jolly Ranchers) or non-mint or non-menthol containing cough drops during this time to soothe your throat.  Use a cough suppressant (Delsym or what I have prescribed you) around the clock during this time.  After three days, gradually increase the use of your voice and back off on the cough suppressants.  URI symptoms: Respiratory viral panel today  Concern for dysphagia Barium swallow  Abnormal chest x-ray: High-resolution CT scan of the chest  We will see you back as previously arranged    Immunizations:  There is no immunization history on file for this patient.   Current Outpatient Medications:    Acetylcysteine (N-ACETYL-L-CYSTEINE PO), Take 600 mg by mouth 2 (two) times a week., Disp: , Rfl:    ALPRAZolam (XANAX) 0.5 MG tablet, Take 0.5 mg by mouth at bedtime as needed., Disp: , Rfl:    Ascorbic Acid (VITAMIN C) 1000 MG tablet, Take 1,000 mg by mouth 2 (two) times a week., Disp: , Rfl:    b complex vitamins capsule, Take 1 capsule by mouth 2 (two) times a week., Disp: , Rfl:    benzonatate (TESSALON) 200 MG capsule, Take 1 capsule (200 mg total) by mouth every 8 (eight) hours as needed for cough., Disp: 45 capsule, Rfl: 1   Calcium Carbonate (CALCIUM 600 PO), Take 1,200 mg by mouth daily., Disp: , Rfl:    celecoxib (CELEBREX) 200 MG capsule, Take 1 capsule (200 mg total) by mouth 2 (two) times daily., Disp: 60 capsule, Rfl: 0   cetirizine (ZYRTEC) 10 MG tablet, Take 10 mg by mouth daily., Disp: , Rfl:    Cholecalciferol (VITAMIN D3) 20 MCG (800 UNIT) TABS, Take 800 Units by mouth daily., Disp: , Rfl:     cycloSPORINE (RESTASIS) 0.05 % ophthalmic emulsion, Place 1 drop into both eyes 2 (two) times daily., Disp: , Rfl:    Flaxseed, Linseed, (FLAXSEED OIL) 1400 MG CAPS, Take 1,400 mg by mouth 2 (two) times a week., Disp: , Rfl:    ibandronate (BONIVA) 150 MG tablet, Take 150 mg by mouth every 30 (thirty) days. Take in the morning with a full glass of water, on an empty stomach, and do not take anything else by mouth or lie down for the next 30 min., Disp: , Rfl:    latanoprost (XALATAN) 0.005 % ophthalmic solution, Place 1 drop into both eyes at bedtime., Disp: , Rfl:    Magnesium 250 MG TABS, Take 250 mg by mouth 2 (  two) times a week., Disp: , Rfl:    Omega-3 Fatty Acids (FISH OIL) 1000 MG CAPS, Take 1,000 mg by mouth 2 (two) times a week., Disp: , Rfl:    Propylene Glycol (SYSTANE BALANCE) 0.6 % SOLN, Place 1 drop into both eyes 2 (two) times daily as needed (dry eyes)., Disp: , Rfl:    QUERCETIN PO, Take 1,000 mg by mouth 2 (two) times a week., Disp: , Rfl:    selenium 200 MCG TABS tablet, Take 200 mcg by mouth 2 (two) times a week., Disp: , Rfl:    sertraline (ZOLOFT) 100 MG tablet, Take 1 tablet (100 mg total) by mouth daily., Disp: 90 tablet, Rfl: 0   timolol (TIMOPTIC) 0.5 % ophthalmic solution, Place 1 drop into both eyes daily., Disp: , Rfl:    Turmeric 500 MG CAPS, Take 500 mg by mouth daily., Disp: , Rfl:    vitamin B-12 (CYANOCOBALAMIN) 1000 MCG tablet, Take 1,000 mcg by mouth daily., Disp: , Rfl:    zinc gluconate 50 MG tablet, Take 50 mg by mouth 2 (two) times a week., Disp: , Rfl:

## 2022-09-21 LAB — RESPIRATORY VIRUS PANEL

## 2022-10-03 ENCOUNTER — Ambulatory Visit (HOSPITAL_COMMUNITY): Payer: Medicare HMO

## 2022-10-03 ENCOUNTER — Other Ambulatory Visit (HOSPITAL_COMMUNITY): Payer: Medicare HMO

## 2022-10-15 ENCOUNTER — Ambulatory Visit (HOSPITAL_COMMUNITY): Payer: Medicare HMO

## 2022-10-30 ENCOUNTER — Ambulatory Visit (HOSPITAL_COMMUNITY)
Admission: RE | Admit: 2022-10-30 | Discharge: 2022-10-30 | Disposition: A | Discharge status: Home / Self Care | Payer: Medicare HMO | Source: Ambulatory Visit | Attending: Pulmonary Disease | Admitting: Pulmonary Disease

## 2022-10-30 ENCOUNTER — Encounter (HOSPITAL_COMMUNITY): Payer: Self-pay

## 2022-10-30 DIAGNOSIS — R1319 Other dysphagia: Secondary | ICD-10-CM | POA: Diagnosis present

## 2022-10-30 DIAGNOSIS — R058 Other specified cough: Secondary | ICD-10-CM | POA: Insufficient documentation

## 2022-10-30 DIAGNOSIS — I700 Atherosclerosis of aorta: Secondary | ICD-10-CM | POA: Insufficient documentation

## 2022-10-31 NOTE — Progress Notes (Signed)
ATC patient x1.  LVM to return call. 

## 2022-11-02 ENCOUNTER — Encounter: Payer: Self-pay | Admitting: Pulmonary Disease

## 2022-11-05 MED ORDER — FLUTICASONE PROPIONATE 50 MCG/ACT NA SUSP
2.0000 | Freq: Every day | NASAL | 3 refills | Status: AC
Start: 2022-11-05 — End: ?

## 2022-11-05 NOTE — Telephone Encounter (Signed)
Mychart message sent by pt: Deborah Fowler "Debby"  P [facility] (supporting Juanito Doom, MD)3 days ago    Since my chest scan and esophagus test came back normal, does Dr. Lake Bells think I still should see an EENT and have my larnyx checked?  I'm still coughing, but very minimally and I'm  not bothered by it.   Also, could you send a 3 month prescription in for Flonase to [facility].  I know you can purchase it OTC, but it's free with a doctor's script.     Rx for flonase has been sent to pharmacy for pt. Dr. Lake Bells, please advise on the first part of pt's message.

## 2023-05-13 IMAGING — CR DG CHEST 2V
2 series · 2 of 2 positions shown · non-contrast
Comparison: X-ray chest 09/10/2019.

CLINICAL DATA: Preoperative evaluation for right knee replacement
scheduled 12/05/2021.

EXAM:
CHEST - 2 VIEW

[w chest pa]
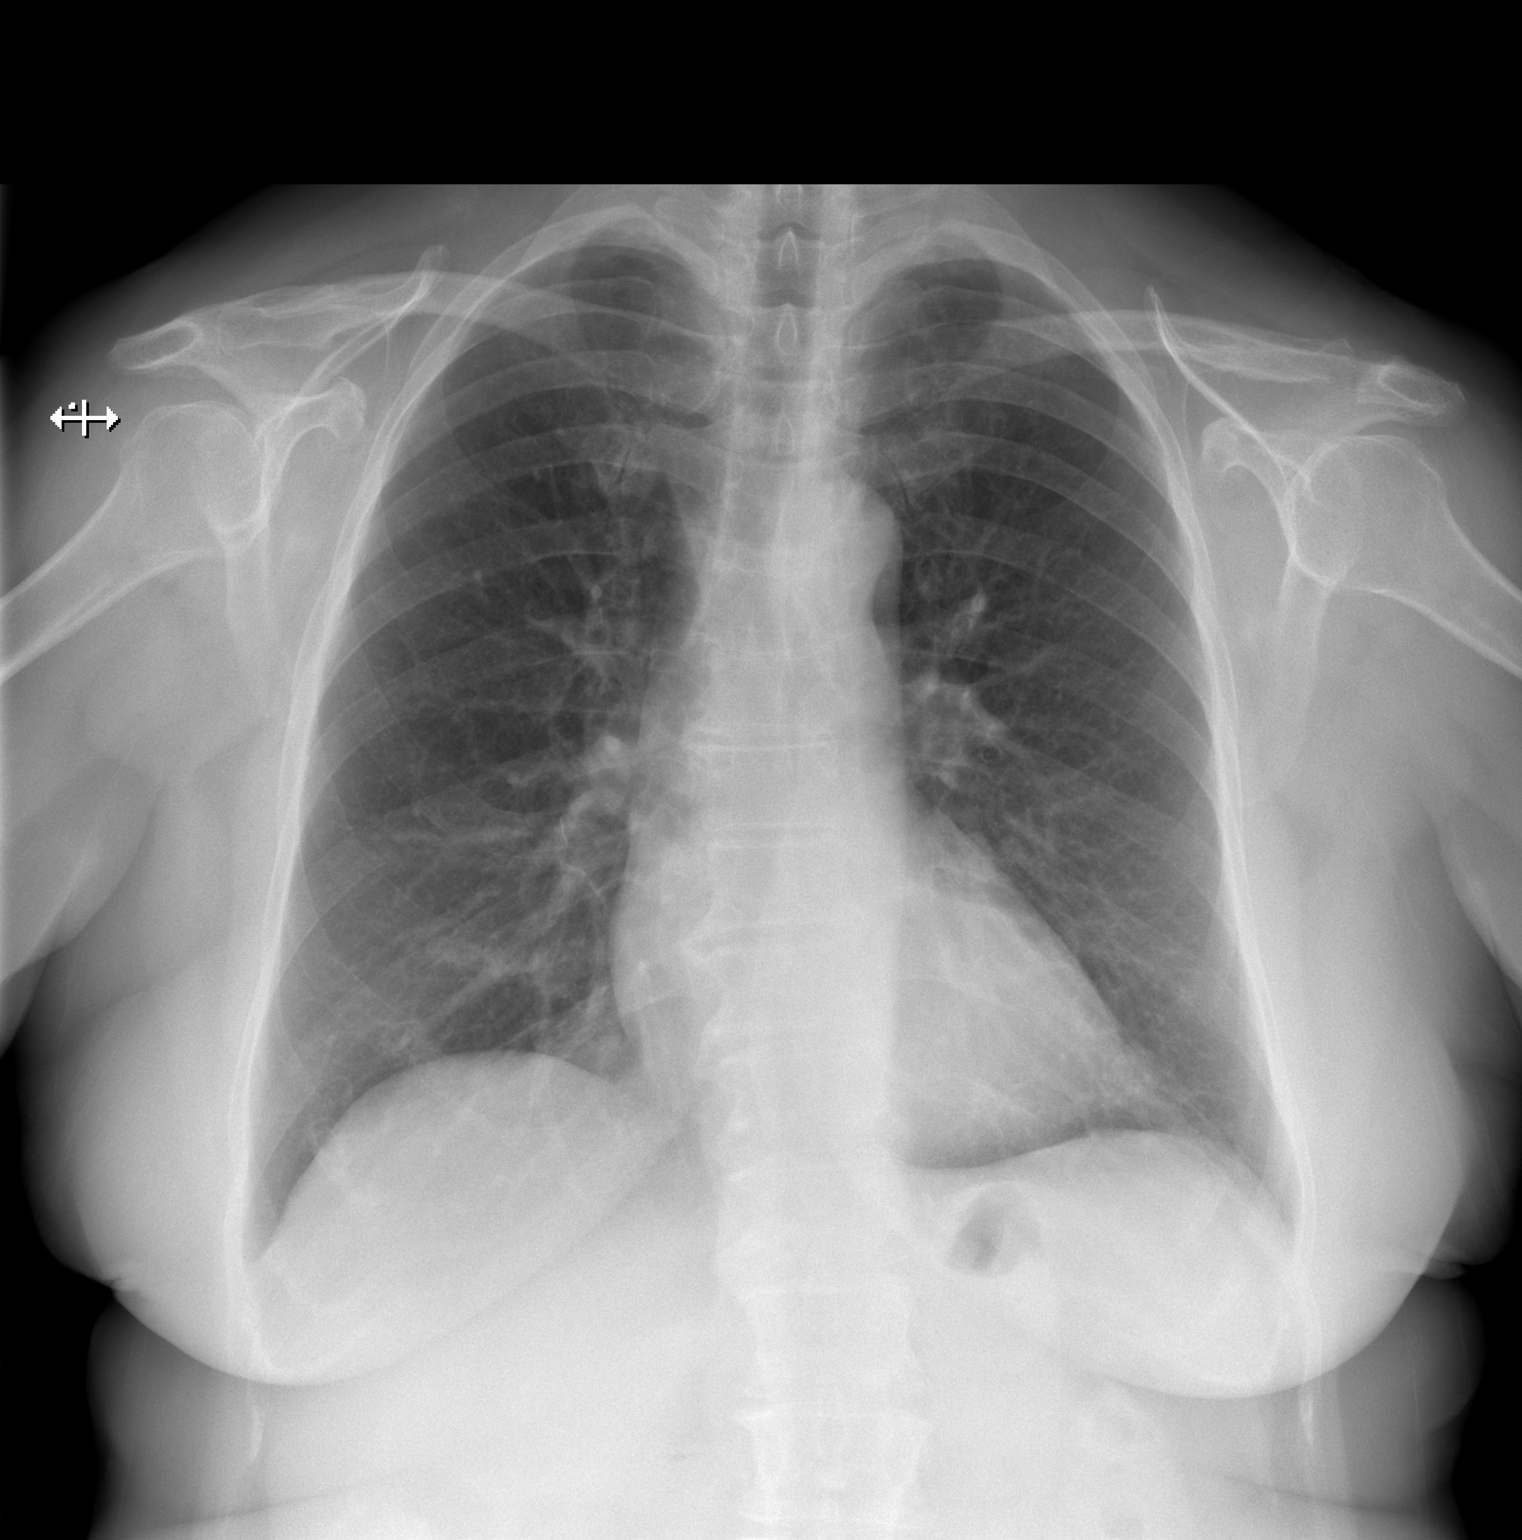

[w chest lat]
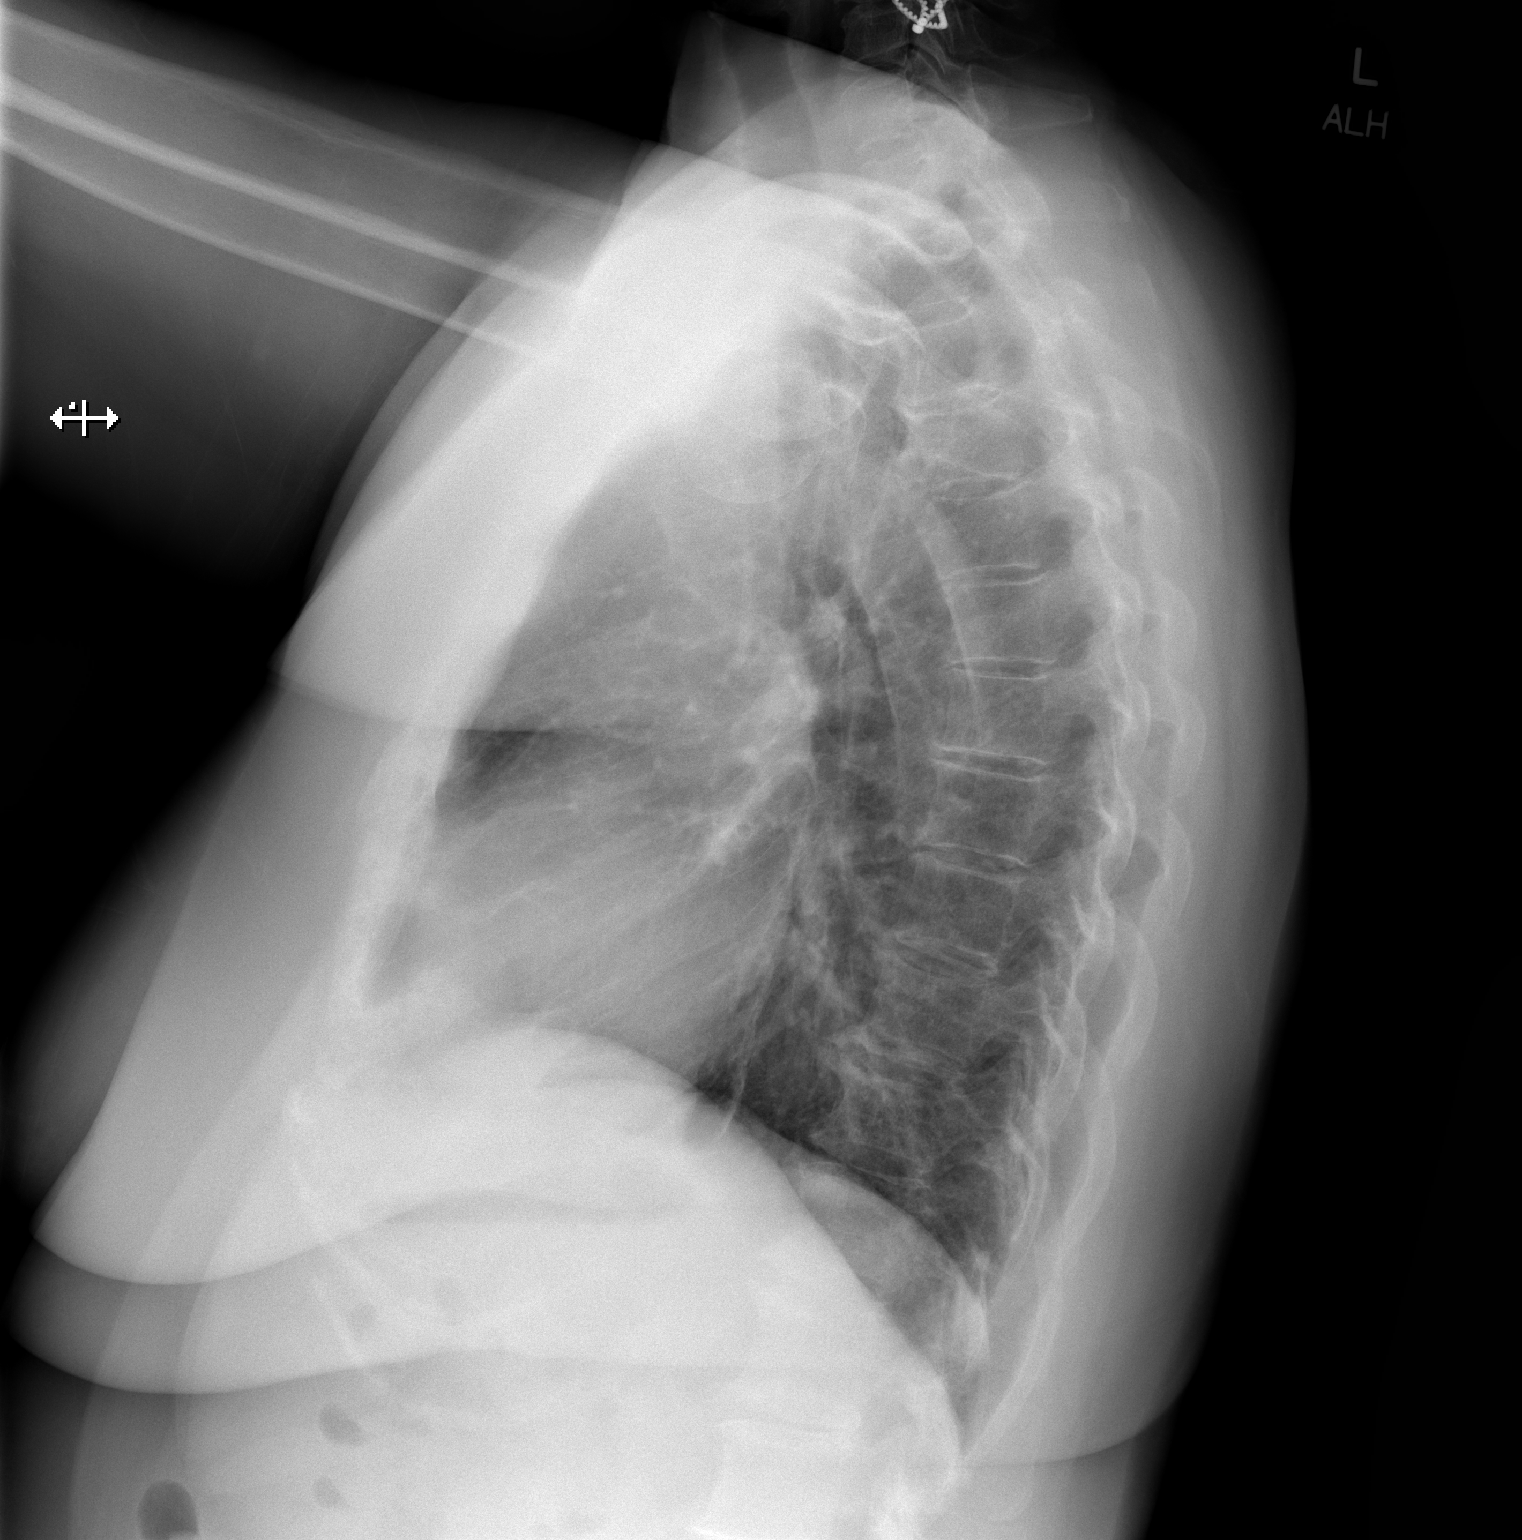

[2 of 2 positions shown; findings below may reference images not displayed]

FINDINGS: Stable cardiomediastinal silhouette with normal heart size. No
pneumothorax. No pleural effusion. Lungs appear clear, with no acute
consolidative airspace disease and no pulmonary edema.
IMPRESSION: No active cardiopulmonary disease.

## 2023-05-31 ENCOUNTER — Other Ambulatory Visit: Payer: Self-pay

## 2023-05-31 ENCOUNTER — Encounter (HOSPITAL_COMMUNITY): Payer: Self-pay

## 2023-05-31 ENCOUNTER — Emergency Department (HOSPITAL_COMMUNITY): Payer: Medicare HMO

## 2023-05-31 ENCOUNTER — Emergency Department (HOSPITAL_COMMUNITY)
Admission: EM | Admit: 2023-05-31 | Discharge: 2023-05-31 | Disposition: A | Discharge status: Home / Self Care | Payer: Medicare HMO | Attending: Emergency Medicine | Admitting: Emergency Medicine

## 2023-05-31 DIAGNOSIS — I10 Essential (primary) hypertension: Secondary | ICD-10-CM | POA: Diagnosis not present

## 2023-05-31 DIAGNOSIS — R079 Chest pain, unspecified: Secondary | ICD-10-CM

## 2023-05-31 DIAGNOSIS — Z79899 Other long term (current) drug therapy: Secondary | ICD-10-CM | POA: Diagnosis not present

## 2023-05-31 DIAGNOSIS — R072 Precordial pain: Secondary | ICD-10-CM | POA: Insufficient documentation

## 2023-05-31 LAB — CBC
HCT: 42.9 % (ref 36.0–46.0)
Hemoglobin: 13.4 g/dL (ref 12.0–15.0)
MCH: 28.9 pg (ref 26.0–34.0)
MCHC: 31.2 g/dL (ref 30.0–36.0)
MCV: 92.5 fL (ref 80.0–100.0)
Platelets: 169 10*3/uL (ref 150–400)
RBC: 4.64 MIL/uL (ref 3.87–5.11)
RDW: 14.7 % (ref 11.5–15.5)
WBC: 6.3 10*3/uL (ref 4.0–10.5)
nRBC: 0 % (ref 0.0–0.2)

## 2023-05-31 LAB — BASIC METABOLIC PANEL
Anion gap: 7 (ref 5–15)
BUN: 10 mg/dL (ref 8–23)
CO2: 22 mmol/L (ref 22–32)
Calcium: 8.8 mg/dL — ABNORMAL LOW (ref 8.9–10.3)
Chloride: 110 mmol/L (ref 98–111)
Creatinine, Ser: 0.82 mg/dL (ref 0.44–1.00)
GFR, Estimated: >60 mL/min (ref >60)
Glucose, Bld: 104 mg/dL — ABNORMAL HIGH (ref 70–99)
Potassium: 4.5 mmol/L (ref 3.5–5.1)
Sodium: 139 mmol/L (ref 135–145)

## 2023-05-31 LAB — TROPONIN I (HIGH SENSITIVITY)
Troponin I (High Sensitivity): 5 ng/L (ref <18)
Troponin I (High Sensitivity): 5 ng/L (ref <18)

## 2023-05-31 LAB — D-DIMER, QUANTITATIVE: D-Dimer, Quant: 0.5 ug/mL-FEU (ref 0.00–0.50)

## 2023-05-31 MED ORDER — ALUM & MAG HYDROXIDE-SIMETH 200-200-20 MG/5ML PO SUSP
30.0000 mL | Freq: Once | ORAL | Status: AC
  Administered 2023-05-31: 30 mL via ORAL
  Filled 2023-05-31: qty 30

## 2023-05-31 MED ORDER — PANTOPRAZOLE SODIUM 20 MG PO TBEC: 20.0000 mg | DELAYED_RELEASE_TABLET | Freq: Every day | ORAL | 0 refills | Status: AC

## 2023-05-31 NOTE — ED Provider Notes (Signed)
[place] EMERGENCY DEPARTMENT AT [facility] Provider Note   CSN: [id number] Arrival date & time: 05/31/23  0900     History  Chief Complaint  Patient presents with   Chest Pain    Deborah Fowler is a 72 y.o. female with history hypertension, hyperlipidemia, presented to ED with chest pain.  Patient reports onset of chest pain about 7 days ago.  She reports substernal chest discomfort that was persistent, worse with arm movement, worse with any motion, lasted about 3 days and then went away.  The pain returned again yesterday.  She denies exertional chest pain preceding this.  She does occasionally have reflux and heartburn, and tried over-the-counter antacid medications, with no relief of her current discomfort.  She also tried Aleve with no relief.  She went to her PCP yesterday who took an EKG and prescribed sublingual nitroglycerin, which she attempted last night, again with no relief of her chest pain.  She currently has moderate chest discomfort.  It is pleuritic in nature.  She denies lightheadedness or loss of consciousness.  She denies any personal or significant family history of MI.  She reports she had an echocardiogram in 2017 which was reportedly normal, but has otherwise not seen a cardiologist.  She denies smoking history.  She denies history of diabetes.  HPI     Home Medications Prior to Admission medications   Medication Sig Start Date End Date Taking? Authorizing Provider  pantoprazole (PROTONIX) 20 MG tablet Take 1 tablet (20 mg total) by mouth daily. 05/31/23 06/30/23 Yes Anival Pasha, Kermit Balo, MD  Acetylcysteine (N-ACETYL-L-CYSTEINE PO) Take 600 mg by mouth 2 (two) times a week.    [provider]  ALPRAZolam Prudy Feeler) 0.5 MG tablet Take 0.5 mg by mouth at bedtime as needed.    [provider]  Ascorbic Acid (VITAMIN C) 1000 MG tablet Take 1,000 mg by mouth 2 (two) times a week.    [provider]  b complex vitamins capsule Take 1  capsule by mouth 2 (two) times a week.    [provider]  benzonatate (TESSALON) 200 MG capsule Take 1 capsule (200 mg total) by mouth every 8 (eight) hours as needed for cough. 09/19/22   Lupita Leash, MD  Calcium Carbonate (CALCIUM 600 PO) Take 1,200 mg by mouth daily.    [provider]  celecoxib (CELEBREX) 200 MG capsule Take 1 capsule (200 mg total) by mouth 2 (two) times daily. 01/17/22   Cassandria Anger, PA-C  cetirizine (ZYRTEC) 10 MG tablet Take 10 mg by mouth daily.    [provider]  Cholecalciferol (VITAMIN D3) 20 MCG (800 UNIT) TABS Take 800 Units by mouth daily.    [provider]  cycloSPORINE (RESTASIS) 0.05 % ophthalmic emulsion Place 1 drop into both eyes 2 (two) times daily. 06/07/20   [provider]  Flaxseed, Linseed, (FLAXSEED OIL) 1400 MG CAPS Take 1,400 mg by mouth 2 (two) times a week.    [provider]  fluticasone (FLONASE) 50 MCG/ACT nasal spray Place 2 sprays into both nostrils daily. 11/05/22   Lupita Leash, MD  ibandronate (BONIVA) 150 MG tablet Take 150 mg by mouth every 30 (thirty) days. Take in the morning with a full glass of water, on an empty stomach, and do not take anything else by mouth or lie down for the next 30 min.    [provider]  latanoprost (XALATAN) 0.005 % ophthalmic solution Place 1 drop into both  eyes at bedtime. 03/10/17   [provider]  Magnesium 250 MG TABS Take 250 mg by mouth 2 (two) times a week.    [provider]  Omega-3 Fatty Acids (FISH OIL) 1000 MG CAPS Take 1,000 mg by mouth 2 (two) times a week.    [provider]  Propylene Glycol (SYSTANE BALANCE) 0.6 % SOLN Place 1 drop into both eyes 2 (two) times daily as needed (dry eyes).    [provider]  QUERCETIN PO Take 1,000 mg by mouth 2 (two) times a week.    [provider]  selenium 200 MCG TABS tablet Take 200 mcg by mouth 2 (two) times a week.    [provider]  sertraline (ZOLOFT) 100 MG tablet Take 1 tablet (100 mg total) by mouth daily. 10/21/19   Opalski, Tytionna, DO  timolol (TIMOPTIC) 0.5 % ophthalmic solution Place 1 drop into both eyes daily. 04/24/21   [provider]  Turmeric 500 MG CAPS Take 500 mg by mouth daily.    [provider]  vitamin B-12 (CYANOCOBALAMIN) 1000 MCG tablet Take 1,000 mcg by mouth daily.    [provider]  zinc gluconate 50 MG tablet Take 50 mg by mouth 2 (two) times a week.    [provider]      Allergies    Patient has no known allergies.    Review of Systems   Review of Systems  Physical Exam Updated Vital Signs BP 100/69   Pulse 90   Temp 97.9 F (36.6 C) (Oral)   Resp 20   Ht 5\' 4"  (1.626 m)   Wt 90.7 kg   SpO2 95%   BMI 34.33 kg/m  Physical Exam Constitutional:      General: She is not in acute distress. HENT:     Head: Normocephalic and atraumatic.  Eyes:     Conjunctiva/sclera: Conjunctivae normal.     Pupils: Pupils are equal, round, and reactive to light.  Cardiovascular:     Rate and Rhythm: Normal rate and regular rhythm.  Pulmonary:     Effort: Pulmonary effort is normal. No respiratory distress.  Abdominal:     General: There is no distension.     Tenderness: There is no abdominal tenderness.  Musculoskeletal:        General: Normal range of motion.  Skin:    General: Skin is warm and dry.  Neurological:     General: No focal deficit present.     Mental Status: She is alert. Mental status is at baseline.  Psychiatric:        Mood and Affect: Mood normal.        Behavior: Behavior normal.     ED Results / Procedures / Treatments   Labs (all labs ordered are listed, but only abnormal results are displayed) Labs Reviewed  BASIC METABOLIC PANEL - Abnormal; Notable for the following components:      Result Value   Glucose, Bld 104 (*)    Calcium 8.8 (*)    All other components within normal limits  CBC  D-DIMER,  QUANTITATIVE  TROPONIN I (HIGH SENSITIVITY)  TROPONIN I (HIGH SENSITIVITY)    EKG EKG Interpretation  Date/Time:  Friday May 31 2023 09:04:19 EDT Ventricular Rate:  61 PR Interval:  168 QRS Duration: 74 QT Interval:  370 QTC Calculation: 372 R Axis:   25 Text Interpretation: Normal sinus rhythm Low voltage QRS Borderline ECG No previous ECGs available Confirmed by Rim Thatch,  Molli Hazard [phone]) on 05/31/2023 9:11:49 AM  Radiology DG Chest 2 View  Result Date: 05/31/2023 CLINICAL DATA:  Chest pain. EXAM: CHEST - 2 VIEW COMPARISON:  05/30/2023. FINDINGS: Clear lungs. Normal heart size and mediastinal contours. No pleural effusion or pneumothorax. Visualized bones and upper abdomen are unremarkable. IMPRESSION: No evidence of acute cardiopulmonary disease. Electronically Signed   By: Orvan Falconer M.D.   On: 05/31/2023 09:40    Procedures Procedures    Medications Ordered in ED Medications  alum & mag hydroxide-simeth (MAALOX/MYLANTA) 200-200-20 MG/5ML suspension 30 mL (30 mLs Oral Given 05/31/23 0951)    ED Course/ Medical Decision Making/ A&P Clinical Course as of 05/31/23 1707  Fri May 31, 2023  1300 I reevaluated the patient, and she was pain-free after the Maalox.  She does have a GI appointment coming up in 1 month, and I think is reasonable to start her on Protonix at this time, and also advised to keep Pepcid and other over-the-counter antacids at home.  It is possible that her symptoms are related to reflux gastritis or esophagitis, but given her age, risk factors of reported hypertension high cholesterol, it is also reasonable to refer her to cardiology for a cardiac evaluation, potential coronary screening. HEART score of 3.  Amb referral placed to cardiology.  I also discussed with the patient and her husband the option of an observation stay in the hospital potential cardiac testing or stress test in the morning, but they are both comfortable with outpatient follow-up, and I  think this is reasonable as they are pain-free. [MT]    Clinical Course User Index [MT] Evelise Reine, Kermit Balo, MD                             Medical Decision Making Amount and/or Complexity of Data Reviewed Labs: ordered. Radiology: ordered.  Risk OTC drugs. Prescription drug management.   This patient presents to the ED with concern for chest pain. This involves an extensive number of treatment options, and is a complaint that carries with it a high risk of complications and morbidity.  The differential diagnosis includes reflux vs esophagitis vs PNA vs ACS vs PE vs PTX vs other  Co-morbidities that complicate the patient evaluation: HTN, HLD  Additional history obtained from husband  I ordered and personally interpreted labs.  The pertinent results include:  trop negative, ddimer negative 0.5, BMP and CBC unremarkable  I ordered imaging studies including dg chest I independently visualized and interpreted imaging which showed no emergent findings I agree with the radiologist interpretation  The patient was maintained on a cardiac monitor.  I personally viewed and interpreted the cardiac monitored which showed an underlying rhythm of: NSR  Per my interpretation the patient's ECG shows NSR with no acute ischemic findings  I ordered medication including maalox for gastritis  I have reviewed the patients home medicines and have made adjustments as needed  Test Considered: doubt acute PE with negative Ddimer; doubt aortic dissection   After the interventions noted above, I reevaluated the patient and found that they have: improved - asymptomatic after medications  Dispostion:  After consideration of the diagnostic results and the patients response to treatment, I feel that the patent would benefit from heartcare referral, with HEART score of 3.  Some features more atypical for reflux, but CV risk factors present.         Final Clinical Impression(s) / ED  Diagnoses Final diagnoses:  Chest pain, unspecified type    Rx / DC Orders ED Discharge Orders          Ordered    Ambulatory referral to Cardiology        05/31/23 1300    pantoprazole (PROTONIX) 20 MG tablet  Daily        05/31/23 1302              Terald Sleeper, MD 05/31/23 [phone]

## 2023-05-31 NOTE — Discharge Instructions (Addendum)
Please follow-up with a cardiologist for further testing performed as an outpatient.  If your symptoms return or worsen, as we discussed, you should return to the emergency department.

## 2023-05-31 NOTE — ED Notes (Signed)
Patient verbalizes understanding of discharge instructions. Opportunity for questioning and answers were provided. Pt discharged from ED. 

## 2023-05-31 NOTE — ED Triage Notes (Signed)
Pt arrived POV. Bilateral CP intermittent x 1 week, sore and tender to touch, denies any other associated symptoms. Denies any precipitating factors.

## 2023-06-06 ENCOUNTER — Ambulatory Visit: Payer: Medicare HMO | Attending: Interventional Cardiology | Admitting: Interventional Cardiology

## 2023-06-06 ENCOUNTER — Encounter: Payer: Self-pay | Admitting: Interventional Cardiology

## 2023-06-06 VITALS — BP 122/74 | HR 80 | Ht 63.0 in | Wt 203.2 lb

## 2023-06-06 DIAGNOSIS — I10 Essential (primary) hypertension: Secondary | ICD-10-CM

## 2023-06-06 DIAGNOSIS — E782 Mixed hyperlipidemia: Secondary | ICD-10-CM

## 2023-06-06 DIAGNOSIS — R072 Precordial pain: Secondary | ICD-10-CM

## 2023-06-06 NOTE — Progress Notes (Signed)
Cardiology Office Note   Date:  06/06/2023   ID:  Deborah Fowler, DOB 09-29-1951, MRN [id number]  PCP:  Nonnie Done., MD    No chief complaint on file.  Chest pain  Wt Readings from Last 3 Encounters:  06/06/23 203 lb 3.2 oz (92.2 kg)  05/31/23 200 lb (90.7 kg)  09/19/22 205 lb 12.8 oz (93.4 kg)       History of Present Illness: Deborah Fowler is a 72 y.o. female who is being seen today for the evaluation of chest pain at the request of Nonnie Done., MD.   Had echo in 2017- reportedly normal  ER visit in June 2024: " history hypertension, hyperlipidemia, presented to ED with chest pain.  Patient reports onset of chest pain about 7 days ago.  She reports substernal chest discomfort that was persistent, worse with arm movement, worse with any motion, lasted about 3 days and then went away.  The pain returned again yesterday.  She denies exertional chest pain preceding this.  She does occasionally have reflux and heartburn, and tried over-the-counter antacid medications, with no relief of her current discomfort.  She also tried Aleve with no relief.  She went to her PCP yesterday who took an EKG and prescribed sublingual nitroglycerin, which she attempted last night, again with no relief of her chest pain.  She currently has moderate chest discomfort.  It is pleuritic in nature.  She denies lightheadedness or loss of consciousness.  She denies any personal or significant family history of MI.  She reports she had an echocardiogram in 2017 which was reportedly normal, but has otherwise not seen a cardiologist.  She denies smoking history.  She denies history of diabetes. "  Protonix was prescribed in the ER after negative troponin x 2, despite having on and off pain for 7 days prior.  Pain resolved after starting Protonix.  She was stressed during the 7 days due to guests in the house.   No prior ischemic testing.  TKR in 2023 which has limited activity.  Since the ER  visit, Denies : Chest pain. Dizziness. Leg edema. Nitroglycerin use. Orthopnea. Palpitations. Paroxysmal nocturnal dyspnea. Shortness of breath. Syncope.    Past Medical History:  Diagnosis Date   Bursitis of both hips    Cervical arthritis    Cobalamin deficiency    DDD (degenerative disc disease), lumbar    Degenerative scoliosis    Depression    Generalized anxiety disorder    Glaucoma, both eyes    Impingement syndrome of both shoulders    followed by emerg orth--- left > right   Mixed hyperlipidemia    PONV (postoperative nausea and vomiting)    SUI (stress urinary incontinence, female)    Tinnitus    intermittant   Wears glasses     Past Surgical History:  Procedure Laterality Date   CARPAL TUNNEL RELEASE  2010   unilateral , pt unsure which side   CYSTOSCOPY WITH INJECTION N/A 06/19/2022   Procedure: CYSTOSCOPY WITH INJECTION OF [id number];  Surgeon: Noel Christmas, MD;  Location: [facility];  Service: Urology;  Laterality: N/A;   PLANTAR FASCIA SURGERY Bilateral 2010   TOTAL KNEE ARTHROPLASTY Right 01/16/2022   Procedure: TOTAL KNEE ARTHROPLASTY;  Surgeon: Durene Romans, MD;  Location: [facility];  Service: Orthopedics;  Laterality: Right;   TUBAL LIGATION     yrs ago     Current Outpatient Medications  Medication Sig Dispense Refill   Acetylcysteine (  N-ACETYL-L-CYSTEINE PO) Take 600 mg by mouth 2 (two) times a week.     Ascorbic Acid (VITAMIN C) 1000 MG tablet Take 1,000 mg by mouth 2 (two) times a week.     atenolol (TENORMIN) 25 MG tablet Take 25 mg by mouth daily.     atorvastatin (LIPITOR) 10 MG tablet Take 10 mg by mouth daily.     b complex vitamins capsule Take 1 capsule by mouth 2 (two) times a week.     Calcium Carbonate (CALCIUM 600 PO) Take 1,200 mg by mouth daily.     celecoxib (CELEBREX) 200 MG capsule Take 1 capsule (200 mg total) by mouth 2 (two) times daily. 60 capsule 0   cetirizine (ZYRTEC) 10 MG tablet Take 10 mg by mouth daily.      Cholecalciferol (VITAMIN D3) 20 MCG (800 UNIT) TABS Take 800 Units by mouth daily.     clobetasol (OLUX) 0.05 % topical foam Apply topically 2 (two) times daily.     cycloSPORINE (RESTASIS) 0.05 % ophthalmic emulsion Place 1 drop into both eyes 2 (two) times daily.     Flaxseed, Linseed, (FLAXSEED OIL) 1400 MG CAPS Take 1,400 mg by mouth 2 (two) times a week.     fluticasone (FLONASE) 50 MCG/ACT nasal spray Place 2 sprays into both nostrils daily. 54 g 3   ibandronate (BONIVA) 150 MG tablet Take 150 mg by mouth every 30 (thirty) days. Take in the morning with a full glass of water, on an empty stomach, and do not take anything else by mouth or lie down for the next 30 min.     latanoprost (XALATAN) 0.005 % ophthalmic solution Place 1 drop into both eyes at bedtime.     Magnesium 250 MG TABS Take 250 mg by mouth 2 (two) times a week.     Omega-3 Fatty Acids (FISH OIL) 1000 MG CAPS Take 1,000 mg by mouth 2 (two) times a week.     pantoprazole (PROTONIX) 20 MG tablet Take 1 tablet (20 mg total) by mouth daily. 30 tablet 0   polyethylene glycol powder (GLYCOLAX/MIRALAX) 17 GM/SCOOP powder Take 1 Container by mouth once.     Propylene Glycol (SYSTANE BALANCE) 0.6 % SOLN Place 1 drop into both eyes 2 (two) times daily as needed (dry eyes).     QUERCETIN PO Take 1,000 mg by mouth 2 (two) times a week.     selenium 200 MCG TABS tablet Take 200 mcg by mouth 2 (two) times a week.     selenium 50 MCG TABS tablet Take 50 mcg by mouth daily.     sertraline (ZOLOFT) 100 MG tablet Take 1 tablet (100 mg total) by mouth daily. 90 tablet 0   timolol (TIMOPTIC) 0.5 % ophthalmic solution Place 1 drop into both eyes daily.     Turmeric 500 MG CAPS Take 500 mg by mouth daily.     UNABLE TO FIND Med Name: vinia     UNABLE TO FIND Med Name: relief factor     UNABLE TO FIND Med Name: spike support     vitamin B-12 (CYANOCOBALAMIN) 1000 MCG tablet Take 1,000 mcg by mouth daily.     zinc gluconate 50 MG tablet Take 50  mg by mouth 2 (two) times a week.     No current facility-administered medications for this visit.    Allergies:   Patient has no known allergies.    Social History:  The patient  reports that she quit smoking about 44  years ago. Her smoking use included cigarettes. She started smoking about 48 years ago. She has a 4.00 pack-year smoking history. She has never used smokeless tobacco. She reports that she does not currently use alcohol. She reports that she does not currently use drugs.   Family History:  The patient's family history includes Alcohol abuse in her father; Colon cancer in her maternal grandmother; Heart attack in her brother; Hyperlipidemia in her mother.    ROS:  Please see the history of present illness.   Otherwise, review of systems are positive for fatigue- not much energy.   All other systems are reviewed and negative.    PHYSICAL EXAM: VS:  BP 122/74   Pulse 80   Ht 5\' 3"  (1.6 m)   Wt 203 lb 3.2 oz (92.2 kg)   SpO2 94%   BMI 36.00 kg/m  , BMI Body mass index is 36 kg/m. GEN: Well nourished, well developed, in no acute distress HEENT: normal Neck: no JVD, carotid bruits, or masses Cardiac: RRR; no murmurs, rubs, or gallops,no edema  Respiratory:  clear to auscultation bilaterally, normal work of breathing GI: soft, nontender, nondistended, + BS MS: no deformity or atrophy Skin: warm and dry, no rash Neuro:  Strength and sensation are intact Psych: euthymic mood, full affect   EKG:   The ekg ordered today demonstrates NSR, no ST changes   Recent Labs: 05/31/2023: BUN 10; Creatinine, Ser 0.82; Hemoglobin 13.4; Platelets 169; Potassium 4.5; Sodium 139   Lipid Panel    Component Value Date/Time   CHOL 233 (H) 06/09/2019 0954   TRIG 196 (H) 06/09/2019 0954   HDL 61 06/09/2019 0954   CHOLHDL 3.8 06/09/2019 0954   LDLCALC 133 (H) 06/09/2019 0954     Other studies Reviewed: Additional studies/ records that were reviewed today with results  demonstrating: labs reviewed.   ASSESSMENT AND PLAN:  Chest pain:   Atypical for cardiac pain. Resolved now after starting Protonix.   Plan For calcium scoring CT.  If calcium score is high or is sx of CP return, could consider stress test.  Cardiac atherosclerosis: Whole food plant-based diet.  Cholesterol-lowering medication is indicated.  Regular exercise.  Atorvastatin 20 mg daily was started.  LDL 133 in June 2020. HTN: The current medical regimen is effective;  continue present plan and medications.  Continue atenolol.    Current medicines are reviewed at length with the patient today.  The patient concerns regarding her medicines were addressed.  The following changes have been made:  No change  Labs/ tests ordered today include: Calcium scoring CT No orders of the defined types were placed in this encounter.   Recommend 150 minutes/week of aerobic exercise Low fat, low carb, high fiber diet recommended  Disposition:   FU based on CT calcium score   Signed, Lance Muss, MD  06/06/2023 4:00 PM    [facility] [address] [place], [place], [place]  [address] Phone: [phone]; Fax: [phone]

## 2023-06-06 NOTE — Patient Instructions (Signed)
Medication Instructions:  Your physician recommends that you continue on your current medications as directed. Please refer to the Current Medication list given to you today.  *If you need a refill on your cardiac medications before your next appointment, please call your pharmacy*   Lab Work: none If you have labs (blood work) drawn today and your tests are completely normal, you will receive your results only by: MyChart Message (if you have MyChart) OR A paper copy in the mail If you have any lab test that is abnormal or we need to change your treatment, we will call you to review the results.   Testing/Procedures: Dr Varanasi recommends you have a Calcium Score CT scan   Follow-Up: At [facility], you and your health needs are our priority.  As part of our continuing mission to provide you with exceptional heart care, we have created designated Provider Care Teams.  These Care Teams include your primary Cardiologist (physician) and Advanced Practice Providers (APPs -  Physician Assistants and Nurse Practitioners) who all work together to provide you with the care you need, when you need it.  We recommend signing up for the patient portal called "MyChart".  Sign up information is provided on this After Visit Summary.  MyChart is used to connect with patients for Virtual Visits (Telemedicine).  Patients are able to view lab/test results, encounter notes, upcoming appointments, etc.  Non-urgent messages can be sent to your provider as well.   To learn more about what you can do with MyChart, go to [email or web address].    Your next appointment:   As needed  Provider:   Jayadeep Varanasi, MD     Other Instructions    

## 2023-06-20 ENCOUNTER — Ambulatory Visit (HOSPITAL_COMMUNITY)
Admission: RE | Admit: 2023-06-20 | Discharge: 2023-06-20 | Disposition: A | Discharge status: Home / Self Care | Payer: Medicare HMO | Source: Ambulatory Visit | Attending: Interventional Cardiology | Admitting: Interventional Cardiology

## 2023-06-20 DIAGNOSIS — R072 Precordial pain: Secondary | ICD-10-CM | POA: Insufficient documentation

## 2023-06-20 DIAGNOSIS — E782 Mixed hyperlipidemia: Secondary | ICD-10-CM | POA: Insufficient documentation

## 2023-06-20 DIAGNOSIS — I10 Essential (primary) hypertension: Secondary | ICD-10-CM | POA: Insufficient documentation

## 2023-07-03 ENCOUNTER — Telehealth: Payer: Self-pay | Admitting: Interventional Cardiology

## 2023-07-03 NOTE — Telephone Encounter (Signed)
Patient is returning calls for results. Please advise

## 2023-07-03 NOTE — Telephone Encounter (Signed)
Left the pt a message to call the office back, so triage nursing can assist her with getting her calcium score results

## 2023-07-03 NOTE — Telephone Encounter (Signed)
  Corky Crafts, MD 06/28/2023  9:59 AM EDT     Calcium score 0. Aortic atherosclerosis.  COntinue atorvastatin. Very small nodule noted in lung.  Will defer to PCP as to whether this requires f/u.  Please cc: PMD. Prn f/u

## 2023-07-03 NOTE — Telephone Encounter (Signed)
Left the pt a message to call the office back, so triage nursing can assist her with getting her calcium score results, per Dr. Eldridge Dace.

## 2023-07-04 NOTE — Telephone Encounter (Signed)
The patient has been notified of the result and verbalized understanding.  All questions (if any) were answered. Frutoso Schatz, RN 07/04/2023 11:53 AM

## 2023-07-09 ENCOUNTER — Encounter: Payer: Self-pay | Admitting: Interventional Cardiology

## 2024-03-06 ENCOUNTER — Other Ambulatory Visit: Payer: Self-pay | Admitting: Obstetrics and Gynecology

## 2024-03-06 DIAGNOSIS — Z13820 Encounter for screening for osteoporosis: Secondary | ICD-10-CM

## 2024-04-22 ENCOUNTER — Encounter (INDEPENDENT_AMBULATORY_CARE_PROVIDER_SITE_OTHER): Payer: Self-pay

## 2024-04-22 ENCOUNTER — Encounter (INDEPENDENT_AMBULATORY_CARE_PROVIDER_SITE_OTHER): Payer: Self-pay | Admitting: Family Medicine

## 2024-04-22 ENCOUNTER — Ambulatory Visit (INDEPENDENT_AMBULATORY_CARE_PROVIDER_SITE_OTHER): Admitting: Family Medicine

## 2024-04-22 VITALS — BP 130/63 | HR 64 | Temp 97.4°F | Ht 63.0 in | Wt 211.0 lb

## 2024-04-22 DIAGNOSIS — I10 Essential (primary) hypertension: Secondary | ICD-10-CM

## 2024-04-22 DIAGNOSIS — E782 Mixed hyperlipidemia: Secondary | ICD-10-CM

## 2024-04-22 DIAGNOSIS — R7303 Prediabetes: Secondary | ICD-10-CM | POA: Diagnosis not present

## 2024-04-22 DIAGNOSIS — E66812 Obesity, class 2: Secondary | ICD-10-CM | POA: Diagnosis not present

## 2024-04-22 DIAGNOSIS — Z6837 Body mass index (BMI) 37.0-37.9, adult: Secondary | ICD-10-CM

## 2024-04-22 NOTE — Progress Notes (Signed)
 Deborah Bugler, DO, ABFM, ABOM Bariatric physician [address] [place], [place], [place] [address] Office: [phone]  /  Fax: [phone]     Initial Evaluation:  Samarrah Tranchina was seen in clinic today to evaluate for obesity. She is interested in losing weight to improve overall health and reduce the risk of weight related complications. She presents today to review program treatment options, initial physical assessment, and evaluation.      She was referred by: PCP - Dr. Rex Castor (works in [facility])   When asked how has your weight affected you? She states: Contributed to medical problems and Contributed to orthopedic problems or mobility issues  Contributing factors to her weight change: Nutritional, Reduced physical activity, and Eating patterns.  Some associated conditions: Hypertension, Arthritis:knees, Hyperlipidemia, and Prediabetes  Current nutrition plan: None  Current level of physical activity: None   Current or previous pharmacotherapy: Phentermine; many years ago.   Response to medication: Had side effects so it was discontinued  When asked what else she hopes to accomplish, she states: Improve existing medical conditions, Reduce number medications, Improve quality quality of life.    Past Medical History:  Diagnosis Date   Bursitis of both hips    Cervical arthritis    Cobalamin deficiency    DDD (degenerative disc disease), lumbar    Degenerative scoliosis    Depression    Generalized anxiety disorder    Glaucoma, both eyes    Impingement syndrome of both shoulders    followed by emerg orth--- left > right   Mixed hyperlipidemia    PONV (postoperative nausea and vomiting)    SUI (stress urinary incontinence, female)    Tinnitus    intermittant   Wears glasses     Current Outpatient Medications  Medication Instructions   Acetylcysteine (N-ACETYL-L-CYSTEINE PO) 600 mg, 2 times weekly   atenolol (TENORMIN) 25 mg, Daily    atorvastatin (LIPITOR) 10 mg, Daily   b complex vitamins capsule 1 capsule, 2 times weekly   Calcium Carbonate (CALCIUM 600 PO) 1,200 mg, Daily   celecoxib  (CELEBREX ) 200 mg, Oral, 2 times daily   cetirizine (ZYRTEC) 10 mg, Daily   clobetasol (OLUX) 0.05 % topical foam 2 times daily   cyanocobalamin (VITAMIN B12) 1,000 mcg, Daily   cycloSPORINE  (RESTASIS ) 0.05 % ophthalmic emulsion 1 drop, 2 times daily   diclofenac (VOLTAREN) 75 MG EC tablet 1 tablet, 2 times daily with meals   dorzolamide-timolol  (COSOPT) 2-0.5 % ophthalmic solution 1 drop, 2 times daily   Fish Oil 1,000 mg, 2 times weekly   Flaxseed Oil 1,400 mg, 2 times weekly   fluticasone  (FLONASE ) 50 MCG/ACT nasal spray 2 sprays, Each Nare, Daily   ibandronate (BONIVA) 150 mg, Every 30 days   latanoprost  (XALATAN ) 0.005 % ophthalmic solution 1 drop, Daily at bedtime   Latanoprostene Bunod (VYZULTA) 0.024 % SOLN Apply to eye.   Magnesium 250 mg, 2 times weekly   pantoprazole  (PROTONIX ) 20 mg, Oral, Daily   polyethylene glycol powder (GLYCOLAX /MIRALAX ) 17 GM/SCOOP powder 1 Container,  Once   Propylene Glycol (SYSTANE BALANCE) 0.6 % SOLN 1 drop, 2 times daily PRN   QUERCETIN PO 1,000 mg, 2 times weekly   selenium 200 mcg, 2 times weekly   selenium 50 mcg, Daily   sertraline  (ZOLOFT ) 100 mg, Oral, Daily   timolol  (TIMOPTIC ) 0.5 % ophthalmic solution 1 drop, Daily   Turmeric 500 mg, Daily   UNABLE TO FIND Med Name: vinia   UNABLE TO FIND Med  Name: relief factor   UNABLE TO FIND Med Name: spike support   vitamin C 1,000 mg, 2 times weekly   Vitamin D3 800 Units, Daily   zinc gluconate 50 mg, 2 times weekly     No Known Allergies   Past Surgical History:  Procedure Laterality Date   CARPAL TUNNEL RELEASE  2010   unilateral , pt unsure which side   CYSTOSCOPY WITH INJECTION N/A 06/19/2022   Procedure: CYSTOSCOPY WITH INJECTION OF [id number];  Surgeon: Roxane Copp, MD;  Location: [facility];  Service:  Urology;  Laterality: N/A;   PLANTAR FASCIA SURGERY Bilateral 2010   TOTAL KNEE ARTHROPLASTY Right 01/16/2022   Procedure: TOTAL KNEE ARTHROPLASTY;  Surgeon: Claiborne Crew, MD;  Location: [facility];  Service: Orthopedics;  Laterality: Right;   TUBAL LIGATION     yrs ago     Family History  Problem Relation Age of Onset   Hyperlipidemia Mother    Alcohol  abuse Father    Heart attack Brother    Colon cancer Maternal Grandmother      Objective:  BP 130/63   Pulse 64   Temp (!) 97.4 F (36.3 C)   Ht 5\' 3"  (1.6 m)   Wt 211 lb (95.7 kg)   SpO2 96%   BMI 37.38 kg/m  She was weighed on the bioimpedance scale: Body mass index is 37.38 kg/m.  Visceral Fat %:   16%  , Body Fat %:   49.1%      Vitals Temp: (!) 97.4 F (36.3 C) BP: 130/63 Pulse Rate: 64 SpO2: 96 %   Anthropometric Measurements Height: 5\' 3"  (1.6 m) Weight: 211 lb (95.7 kg) BMI (Calculated): 37.39 Weight at Last Visit: NA Weight Lost Since Last Visit: NA Weight Gained Since Last Visit: NA Starting Weight: NA Total Weight Loss (lbs):  (0 kg) (NA) Peak Weight: 217lb Waist Measurement : 0 inches (NA)   Body Composition  Body Fat %: 49.1 % Fat Mass (lbs): 104 lbs Muscle Mass (lbs): 102.4 lbs Total Body Water  (lbs): 75.8 lbs Visceral Fat Rating : 16   Other Clinical Data A1c: 0 mg/dL (NA) RMR: 0 (NA) Fasting: No Labs: No Today's Visit #: Info Session Starting Date:  (NA) Comments: Info Session     General: Well Developed, well nourished, and in no acute distress.  HEENT: Normocephalic, atraumatic; EOMI, sclerae are anicteric. Skin: Warm and dry, good turgor Chest:  Normal excursion, shape, no gross ABN Respiratory: No conversational dyspnea; speaking in full sentences NeuroM-Sk:  Normal gross ROM * 4 extremities  Psych: A and O *3, insight adequate, mood- full    Assessment and Plan:   FOR THE DISEASE OF OBESITY: BMI 37.0-37.9, adult -- Current BMI 37.39 Class 2 obesity with body mass  index (BMI) of 37.0 to 37.9 in adult, unspecified obesity type, unspecified whether serious comorbidity present Assessment & Plan: We reviewed anthropometrics, biometrics, associated medical conditions and contributing factors with patient. Ann-Marie would benefit from a medically tailored reduced calorie nutrional plan based on his REE (resting energy expenditure), which will be determined by indirect calorimetry.  We will also assess for cardiometabolic risk and nutritional derangements via fasting labs at intake appointment. Pt did at one point state she is uncertain she needs the program; stating she "already knows" what she "has to do to lose weight".    Obesity Treatment / Action Plan:   she was weighed on the bioimpedance scale and results were discussed and documented in the  synopsis.   Corean Deutscher will complete provided nutritional and psychosocial assessment questionnaire before the next appointment.  she will be scheduled for indirect calorimetry to determine resting energy expenditure in a fasting state.  This will allow us  to create a reduced calorie, high-protein meal plan to promote loss of fat mass while preserving muscle mass.  We will also assess for cardiometabolic risk and nutritional derangements via an ECG and fasting serologies at her next appointment.  she was encouraged to work on amassing support from family and friends to begin their weight loss journey.   Work on eliminating or reducing the presence of highly processed, poorly nutritious, calorie-dense foods in the home.   Obesity Education Performed Today:  Patient was counseled on nutritional approaches to weight loss and benefits of reducing processed foods and consuming plant-based foods and high quality protein as part of nutritional weight management program.   We discussed the importance of long term lifestyle changes which include nutrition, exercise and behavioral modifications as well as the importance  of customizing this to her specific health and social needs.   We discussed the benefits of reaching a healthier weight to alleviate the symptoms of existing conditions and reduce the risks of the biomechanical, metabolic and psychological effects of obesity.  Was counseled on the health benefits of losing 5%-10% of total body weight.  Was counseled on our cognitive behavorial therapy program, lead by our bariatric psychologist, who focuses on emotional eating and creating positive behavorial change.  Was counseled on bariatric pharmacotherapy and how this may be used as an adjunct in their weight management    Lochlyn appears to be in the action stage of change and states they are ready to start intensive lifestyle modifications and behavioral modifications.  It was recommended that she follow up in the next 1-2 weeks to review the above steps, and to continue with treatment of their chronic disease state of obesity   FOR OTHER CONDITIONS RELATED TO THE DISEASE OF OBESITY: Mixed hyperlipidemia Assessment & Plan: Lab Results  Component Value Date   CHOL 233 (H) 06/09/2019   HDL 61 06/09/2019   LDLCALC 133 (H) 06/09/2019   TRIG 196 (H) 06/09/2019   CHOLHDL 3.8 06/09/2019   Has had HLD for about 30 years. Recently started Lipitor 10 mg once daily in 01/2023. Tolerating well with no adverse SE reported today. Reviewed the importance of healthy eating habits and exercise in an effort to lose weight and improve overall cardiovascular health. Continue current statin therapy regimen as prescribed.    Prediabetes Assessment & Plan: Lab Results  Component Value Date   HGBA1C 5.6 06/09/2019   HGBA1C 5.3 03/13/2018    Pt reports her PCP has tried to get GLP-1s approved for Pre-DM in the past and was denied twice in 08/2023 and 01/2024. She reports minimal physical activity due to physical limitations from right knee pain. Denies any fmhx of diabetes. Pt advised to continue efforts with healthy  eating habits and exercise to lose weight and decrease risk of progression of condition to diabetes. Reviewed potential risks and side effects of wt loss injectables without first attempting/incorporating proper lifestyle modifications.    Hypertension, unspecified type Assessment & Plan: BP Readings from Last 3 Encounters:  04/22/24 130/63  06/06/23 122/74  05/31/23 100/69   Recently dx with HTN in the last 1-2 years and started Atenolol 25 mg once daily in 01/2023. Tolerating well with no adverse SE reported today. Monitors her BP at home, readings average  at 130s/70s. Reports a reading of 135/75 earlier today. Continue current antihypertensive regimen as prescribed. Reviewed the benefits of wt loss as it relates to HTN management and cardiovascular health. Continue following up with PCP/specialists as instructed by them.    Attestations:   Cherryl Corona, acting as a medical scribe for Marceil Sensor, DO., have compiled all relevant documentation for today's office visit on behalf of Glori Machnik, DO, while in the presence of [employer], DO.  Reviewed by clinician on day of visit: allergies, medications, problem list, medical history, surgical history, family history, social history, and previous encounter notes pertinent to obesity diagnosis.    I have reviewed the above documentation for accuracy and completeness, and I agree with the above. Deborah Fowler, D.O.  The 21st Century Cures Act was signed into law in 2016 which includes the topic of electronic health records.  This provides immediate access to information in MyChart.  This includes consultation notes, operative notes, office notes, lab results and pathology reports.  If you have any questions about what you read please let us  know at your next visit so we can discuss your concerns and take corrective action if need be.  We are right here with you!
